# Patient Record
Sex: Female | Born: 1950 | Race: White | Hispanic: No | Smoking: Current every day smoker
Health system: Southern US, Community
[De-identification: ages and names within clinical notes are randomized; demographics above are authoritative.]

## PROBLEM LIST (undated history)

## (undated) DIAGNOSIS — K529 Noninfective gastroenteritis and colitis, unspecified: Secondary | ICD-10-CM

## (undated) DIAGNOSIS — C801 Malignant (primary) neoplasm, unspecified: Secondary | ICD-10-CM

## (undated) DIAGNOSIS — I10 Essential (primary) hypertension: Secondary | ICD-10-CM

## (undated) DIAGNOSIS — I739 Peripheral vascular disease, unspecified: Secondary | ICD-10-CM

## (undated) DIAGNOSIS — K219 Gastro-esophageal reflux disease without esophagitis: Secondary | ICD-10-CM

## (undated) DIAGNOSIS — Z720 Tobacco use: Secondary | ICD-10-CM

## (undated) DIAGNOSIS — E785 Hyperlipidemia, unspecified: Secondary | ICD-10-CM

## (undated) HISTORY — DX: Peripheral vascular disease, unspecified: I73.9

## (undated) HISTORY — DX: Gastro-esophageal reflux disease without esophagitis: K21.9

## (undated) HISTORY — DX: Tobacco use: Z72.0

## (undated) HISTORY — PX: ABDOMINAL HYSTERECTOMY: SHX81

## (undated) HISTORY — PX: CHOLECYSTECTOMY: SHX55

## (undated) HISTORY — PX: APPENDECTOMY: SHX54

## (undated) HISTORY — PX: MASTECTOMY: SHX3

## (undated) HISTORY — DX: Hyperlipidemia, unspecified: E78.5

## (undated) HISTORY — DX: Malignant (primary) neoplasm, unspecified: C80.1

## (undated) HISTORY — DX: Essential (primary) hypertension: I10

## (undated) HISTORY — PX: NOSE SURGERY: SHX723

## (undated) HISTORY — DX: Noninfective gastroenteritis and colitis, unspecified: K52.9

---

## 1999-04-25 ENCOUNTER — Other Ambulatory Visit: Admission: RE | Admit: 1999-04-25 | Discharge: 1999-04-25 | Payer: Self-pay | Admitting: Urology

## 1999-11-11 ENCOUNTER — Encounter: Admission: RE | Admit: 1999-11-11 | Discharge: 1999-11-11 | Payer: Self-pay | Admitting: Family Medicine

## 1999-11-11 ENCOUNTER — Encounter: Payer: Self-pay | Admitting: Family Medicine

## 2000-06-11 ENCOUNTER — Encounter: Admission: RE | Admit: 2000-06-11 | Discharge: 2000-06-26 | Payer: Self-pay | Admitting: Family Medicine

## 2000-10-28 ENCOUNTER — Encounter (INDEPENDENT_AMBULATORY_CARE_PROVIDER_SITE_OTHER): Payer: Self-pay | Admitting: Specialist

## 2000-10-28 ENCOUNTER — Encounter: Payer: Self-pay | Admitting: Internal Medicine

## 2000-10-28 ENCOUNTER — Other Ambulatory Visit: Admission: RE | Admit: 2000-10-28 | Discharge: 2000-10-28 | Payer: Self-pay | Admitting: Internal Medicine

## 2000-11-06 ENCOUNTER — Ambulatory Visit (HOSPITAL_COMMUNITY): Admission: RE | Admit: 2000-11-06 | Discharge: 2000-11-06 | Payer: Self-pay | Admitting: Family Medicine

## 2000-11-06 ENCOUNTER — Encounter: Payer: Self-pay | Admitting: Family Medicine

## 2000-12-14 ENCOUNTER — Encounter: Payer: Self-pay | Admitting: Internal Medicine

## 2000-12-14 ENCOUNTER — Ambulatory Visit (HOSPITAL_COMMUNITY): Admission: RE | Admit: 2000-12-14 | Discharge: 2000-12-14 | Payer: Self-pay | Admitting: Internal Medicine

## 2001-03-07 ENCOUNTER — Ambulatory Visit (HOSPITAL_COMMUNITY): Admission: RE | Admit: 2001-03-07 | Discharge: 2001-03-07 | Payer: Self-pay | Admitting: Orthopaedic Surgery

## 2001-03-07 ENCOUNTER — Encounter: Payer: Self-pay | Admitting: Orthopaedic Surgery

## 2001-12-14 ENCOUNTER — Encounter: Admission: RE | Admit: 2001-12-14 | Discharge: 2001-12-14 | Payer: Self-pay | Admitting: Family Medicine

## 2001-12-14 ENCOUNTER — Encounter: Payer: Self-pay | Admitting: Family Medicine

## 2004-06-05 ENCOUNTER — Ambulatory Visit: Payer: Self-pay | Admitting: Family Medicine

## 2004-08-05 ENCOUNTER — Ambulatory Visit: Payer: Self-pay | Admitting: Family Medicine

## 2004-08-12 ENCOUNTER — Ambulatory Visit: Payer: Self-pay | Admitting: Family Medicine

## 2004-09-09 ENCOUNTER — Ambulatory Visit: Payer: Self-pay | Admitting: Family Medicine

## 2004-09-09 ENCOUNTER — Encounter: Admission: RE | Admit: 2004-09-09 | Discharge: 2004-09-09 | Payer: Self-pay | Admitting: Family Medicine

## 2004-09-20 ENCOUNTER — Encounter: Admission: RE | Admit: 2004-09-20 | Discharge: 2004-09-20 | Payer: Self-pay | Admitting: Family Medicine

## 2004-11-22 ENCOUNTER — Ambulatory Visit: Payer: Self-pay | Admitting: Family Medicine

## 2004-11-28 ENCOUNTER — Ambulatory Visit: Payer: Self-pay | Admitting: Family Medicine

## 2004-12-02 ENCOUNTER — Ambulatory Visit: Payer: Self-pay | Admitting: Family Medicine

## 2005-11-06 ENCOUNTER — Ambulatory Visit: Payer: Self-pay | Admitting: Family Medicine

## 2005-11-13 ENCOUNTER — Ambulatory Visit: Payer: Self-pay | Admitting: Family Medicine

## 2005-11-13 ENCOUNTER — Encounter: Admission: RE | Admit: 2005-11-13 | Discharge: 2005-11-13 | Payer: Self-pay | Admitting: Family Medicine

## 2005-11-28 ENCOUNTER — Ambulatory Visit: Payer: Self-pay | Admitting: Cardiology

## 2006-02-03 ENCOUNTER — Ambulatory Visit: Payer: Self-pay | Admitting: Family Medicine

## 2006-07-07 ENCOUNTER — Ambulatory Visit: Payer: Self-pay | Admitting: Family Medicine

## 2006-10-26 ENCOUNTER — Encounter: Payer: Self-pay | Admitting: Family Medicine

## 2006-10-26 DIAGNOSIS — I10 Essential (primary) hypertension: Secondary | ICD-10-CM | POA: Insufficient documentation

## 2006-10-26 DIAGNOSIS — F17200 Nicotine dependence, unspecified, uncomplicated: Secondary | ICD-10-CM

## 2006-10-26 DIAGNOSIS — E785 Hyperlipidemia, unspecified: Secondary | ICD-10-CM | POA: Insufficient documentation

## 2006-10-26 DIAGNOSIS — I739 Peripheral vascular disease, unspecified: Secondary | ICD-10-CM

## 2006-11-12 DIAGNOSIS — Z853 Personal history of malignant neoplasm of breast: Secondary | ICD-10-CM

## 2007-01-25 ENCOUNTER — Ambulatory Visit: Payer: Self-pay | Admitting: Family Medicine

## 2007-01-25 DIAGNOSIS — L0291 Cutaneous abscess, unspecified: Secondary | ICD-10-CM

## 2007-01-25 DIAGNOSIS — L0390 Cellulitis, unspecified: Secondary | ICD-10-CM

## 2007-02-03 ENCOUNTER — Encounter: Payer: Self-pay | Admitting: Family Medicine

## 2007-07-22 ENCOUNTER — Encounter: Payer: Self-pay | Admitting: Family Medicine

## 2007-07-23 ENCOUNTER — Ambulatory Visit: Payer: Self-pay | Admitting: Family Medicine

## 2007-07-28 DIAGNOSIS — L820 Inflamed seborrheic keratosis: Secondary | ICD-10-CM | POA: Insufficient documentation

## 2007-12-28 ENCOUNTER — Ambulatory Visit: Payer: Self-pay | Admitting: Family Medicine

## 2007-12-28 LAB — CONVERTED CEMR LAB
ALT: 15 units/L (ref 0–35)
Albumin: 3.7 g/dL (ref 3.5–5.2)
Bilirubin, Direct: 0.1 mg/dL (ref 0.0–0.3)
CO2: 31 meq/L (ref 19–32)
Calcium: 9.2 mg/dL (ref 8.4–10.5)
Eosinophils Absolute: 0.4 10*3/uL (ref 0.0–0.7)
Eosinophils Relative: 4.5 % (ref 0.0–5.0)
GFR calc Af Amer: 95 mL/min
GFR calc non Af Amer: 79 mL/min
HDL: 34.9 mg/dL — ABNORMAL LOW (ref >39.0)
Lymphocytes Relative: 33.9 % (ref 12.0–46.0)
Monocytes Relative: 5 % (ref 3.0–12.0)
Neutrophils Relative %: 56 % (ref 43.0–77.0)
Nitrite: NEGATIVE
RBC: 4.52 M/uL (ref 3.87–5.11)
RDW: 12.8 % (ref 11.5–14.6)
TSH: 0.45 microintl units/mL (ref 0.35–5.50)
Total Bilirubin: 0.6 mg/dL (ref 0.3–1.2)
Total Protein: 6.8 g/dL (ref 6.0–8.3)
Urobilinogen, UA: 0.2
WBC Urine, dipstick: NEGATIVE
WBC: 8.9 10*3/uL (ref 4.5–10.5)
pH: 8.5

## 2008-01-04 ENCOUNTER — Ambulatory Visit: Payer: Self-pay | Admitting: Family Medicine

## 2008-05-22 DIAGNOSIS — J069 Acute upper respiratory infection, unspecified: Secondary | ICD-10-CM | POA: Insufficient documentation

## 2008-05-24 ENCOUNTER — Ambulatory Visit: Payer: Self-pay | Admitting: Family Medicine

## 2008-06-22 ENCOUNTER — Telehealth: Payer: Self-pay | Admitting: Family Medicine

## 2008-06-29 ENCOUNTER — Ambulatory Visit: Payer: Self-pay | Admitting: Family Medicine

## 2008-06-29 DIAGNOSIS — K219 Gastro-esophageal reflux disease without esophagitis: Secondary | ICD-10-CM

## 2008-08-21 ENCOUNTER — Telehealth: Payer: Self-pay | Admitting: Family Medicine

## 2008-09-01 ENCOUNTER — Ambulatory Visit: Payer: Self-pay | Admitting: Internal Medicine

## 2008-09-01 DIAGNOSIS — H6520 Chronic serous otitis media, unspecified ear: Secondary | ICD-10-CM | POA: Insufficient documentation

## 2009-04-23 ENCOUNTER — Ambulatory Visit: Payer: Self-pay | Admitting: Family Medicine

## 2009-04-26 ENCOUNTER — Ambulatory Visit: Payer: Self-pay | Admitting: Family Medicine

## 2009-04-26 DIAGNOSIS — J45909 Unspecified asthma, uncomplicated: Secondary | ICD-10-CM | POA: Insufficient documentation

## 2009-04-30 ENCOUNTER — Ambulatory Visit: Payer: Self-pay | Admitting: Family Medicine

## 2009-06-26 ENCOUNTER — Ambulatory Visit: Payer: Self-pay | Admitting: Family Medicine

## 2009-10-17 ENCOUNTER — Ambulatory Visit: Payer: Self-pay | Admitting: Family Medicine

## 2009-10-17 DIAGNOSIS — S6390XA Sprain of unspecified part of unspecified wrist and hand, initial encounter: Secondary | ICD-10-CM | POA: Insufficient documentation

## 2009-10-18 ENCOUNTER — Ambulatory Visit: Payer: Self-pay | Admitting: Family Medicine

## 2010-02-12 ENCOUNTER — Telehealth: Payer: Self-pay | Admitting: Family Medicine

## 2010-02-28 ENCOUNTER — Ambulatory Visit: Payer: Self-pay | Admitting: Family Medicine

## 2010-02-28 DIAGNOSIS — J029 Acute pharyngitis, unspecified: Secondary | ICD-10-CM

## 2010-02-28 LAB — CONVERTED CEMR LAB
Albumin: 4 g/dL (ref 3.5–5.2)
Basophils Absolute: 0 10*3/uL (ref 0.0–0.1)
Bilirubin, Direct: 0.1 mg/dL (ref 0.0–0.3)
Chloride: 99 meq/L (ref 96–112)
Creatinine, Ser: 0.7 mg/dL (ref 0.4–1.2)
Eosinophils Absolute: 0.4 10*3/uL (ref 0.0–0.7)
Eosinophils Relative: 3.5 % (ref 0.0–5.0)
Glucose, Bld: 85 mg/dL (ref 70–99)
Lymphs Abs: 2.7 10*3/uL (ref 0.7–4.0)
Monocytes Absolute: 0.5 10*3/uL (ref 0.1–1.0)
Nitrite: NEGATIVE
Platelets: 279 10*3/uL (ref 150.0–400.0)
Rapid Strep: NEGATIVE
Sodium: 144 meq/L (ref 135–145)
Total Bilirubin: 0.6 mg/dL (ref 0.3–1.2)
Triglycerides: 170 mg/dL — ABNORMAL HIGH (ref 0.0–149.0)
WBC: 9.9 10*3/uL (ref 4.5–10.5)
pH: 8.5

## 2010-03-11 ENCOUNTER — Ambulatory Visit: Payer: Self-pay | Admitting: Family Medicine

## 2010-04-30 ENCOUNTER — Encounter: Payer: Self-pay | Admitting: Family Medicine

## 2010-07-25 NOTE — Progress Notes (Signed)
Summary: refills  Phone Note Call from Patient Message from:  Patient on February 13, 2010 8:11 AM  Caller: Patient Summary of Call: patient is calling because she would like a refill of her medication.  However the patient has not had a physical or labs since 09.  Please call the patient and have her schedule.  [phone] Initial call taken by: Kern Reap CMA Duncan Dull),  February 12, 2010 2:55 PM  Follow-up for Phone Call        Called pts cell # and left msg for her to c/b so appt can be scheduled.... Also called home # and left msg with pts husband req pt to c/b.  Follow-up by: Debbra Riding,  February 12, 2010 3:03 PM  Additional Follow-up for Phone Call Additional follow up Details #1::        Called pt this morning and spoke with her about scheduling a cpx / labwork with Dr Tawanna Cooler.... Pt scheduled appt for labwork on 9/8 @ 9:45am...Marland KitchenMarland KitchenCPX w/ Dr Tawanna Cooler on 9/19 @ 2pm.... Pt adv she is almost out of meds and req that Rx for enough of med till her appt date be sent into [facility] [address].Marland KitchenMarland KitchenMarland KitchenRecent refill request sent?  Additional Follow-up by: Debbra Riding,  February 13, 2010 8:11 AM    Prescriptions: ZOCOR 40 MG  TABS (SIMVASTATIN) 1 tab @ bedtime  #30 x 1   Entered by:   Kern Reap CMA (AAMA)   Authorized by:   Roderick Pee MD   Signed by:   Kern Reap CMA (AAMA) on 02/13/2010   Method used:   [facility]        [facility]  [address] [id number]       [address]       [place], [place]  [address]       Ph: [phone] or [phone]       Fax: [phone]   RxID:   [id number] PRILOSEC 20 MG  CPDR (OMEPRAZOLE) one daily  #30 x 1   Entered by:   Kern Reap CMA (AAMA)   Authorized by:   Roderick Pee MD   Signed by:   Kern Reap CMA (AAMA) on 02/13/2010   Method used:   [facility]        [facility]  [address] [id number]       [address]       [place], [place]  [address]       Ph: [phone] or  [phone]       Fax: [phone]   RxID:   [id number] TENORETIC 50 50-25 MG  TABS (ATENOLOL-CHLORTHALIDONE) one by mouth daily  #30 x 1   Entered by:   Kern Reap CMA (AAMA)   Authorized by:   Roderick Pee MD   Signed by:   Kern Reap CMA (AAMA) on 02/13/2010   Method used:   [facility]        [facility]  [address] [id number]       [address]       [place], [place]  [address]       Ph: [phone] or [phone]       Fax: [phone]   RxID:   [id number]   Appended Document: refills and called in to pharmacy

## 2010-07-25 NOTE — Letter (Signed)
Summary: Referral - not able to see patient  [facility]  [address] [place], [place] [address]   Phone: [phone]  Fax: [phone]    April 30, 2010   Tinnie Gens A. Tawanna Cooler, M.D. [address] [place], [place] [address]   Re:   Natalie Benson DOB:  1950/10/12 MRN:   [id number]    Dear Dr. Tawanna Cooler:  Thank you for your kind referral of the above patient.  We have attempted to schedule the recommended procedure Screening Colonoscopy but have not been able to schedule because:  ___ The patient was not available by phone and/or has not returned our calls.   X  The patient declined to schedule the procedure at this time.  We appreciate the referral and hope that we will have the opportunity to treat this patient in the future.    Sincerely,    [facility] [phone]

## 2010-07-25 NOTE — Assessment & Plan Note (Signed)
Summary: ? BRONCHIAL ISSUES//CCM   Vital Signs:  Patient profile:   60 year old female Height:      66 inches Weight:      166.50 pounds BMI:     26.97 O2 Sat:      96 % on Room air Temp:     97.8 degrees F oral Pulse rate:   67 / minute BP sitting:   134 / 78  (left arm)  Vitals Entered By: Lucious Groves (June 26, 2009 12:07 PM)  O2 Flow:  Room air CC: Pt c/o cold and bronchial issues with occ SOB x 2 weeks. Cough is mucous producing and per pt it is white to yellow in color. Pt is still smoking, but has cut down.f/kb   CC:  Pt c/o cold and bronchial issues with occ SOB x 2 weeks. Cough is mucous producing and per pt it is white to yellow in color. Pt is still smoking and but has cut down.f/kb.  History of Present Illness: care of is a 60 year old, married female, smoker, who comes in with a 10-day history of head congestion, cough.  She has no fever, chills, earache, sore throat, or sputum production.  She continues to smoke.  In November.  She had a flare of her COPD which required prednisone.  She declines a formal smoking cessation program.  However, she is agreeable to trying the nicotine patches  Current Medications (verified): 1)  Adult Aspirin Low Strength 81 Mg  Chew (Aspirin) .... One By Mouth Daily 2)  Tenoretic 50 50-25 Mg  Tabs (Atenolol-Chlorthalidone) .... One By Mouth Daily 3)  Prilosec 20 Mg  Cpdr (Omeprazole) .... One Daily 4)  Zocor 40 Mg  Tabs (Simvastatin) .Marland Kitchen.. 1 Tab @ Bedtime 5)  Hydromet 5-1.5 Mg/20ml Syrp (Hydrocodone-Homatropine) .Marland Kitchen.. 1 or 2 Tsps Three Times A Day As Needed 6)  Hydromet 5-1.5 Mg/67ml Syrp (Hydrocodone-Homatropine) .Marland Kitchen.. 1 or 2 Tsps At Bedtime As Needed 7)  Primatene Asthma 12.5-200 Mg Tabs (Ephedrine-Guaifenesin) .Marland Kitchen.. 1 By Two Times A Day or Tid  Allergies (verified): No Known Drug Allergies  Past History:  Past medical, surgical, family and social histories (including risk factors) reviewed for relevance to current acute and chronic  problems.  Past Medical History: Reviewed history from 07/26/2008 and no changes required. Hyperlipidemia Hypertension Breast cancer, hx of tobacco abuse cholecystectomy appendectomy vaginal hysterectomy peripheral vascular disease.  Right carotid bruit GERD Collangenous colitis  Past Surgical History: Reviewed history from 07/26/2008 and no changes required. Cholecystectomy 1970's Hysterectomy-vag dub Mastectomy-bil. Appendectomy  Family History: Reviewed history from 01/04/2008 and no changes required. father died at 18 of congestive heart failure mother died at 79 GI cancer  3 brothers, one has COPD, one has hypertension4 sisters, one has had breast cancer twice noted to have bed rest, cancer.  Once  Social History: Reviewed history from 01/04/2008 and no changes required. Occupation: Married Current Smoker Alcohol use-no Drug use-no Regular exercise-yes  Review of Systems      See HPI  Physical Exam  General:  Well-developed,well-nourished,in no acute distress; alert,appropriate and cooperative throughout examination Head:  Normocephalic and atraumatic without obvious abnormalities. No apparent alopecia or balding. Eyes:  No corneal or conjunctival inflammation noted. EOMI. Perrla. Funduscopic exam benign, without hemorrhages, exudates or papilledema. Vision grossly normal. Ears:  External ear exam shows no significant lesions or deformities.  Otoscopic examination reveals clear canals, tympanic membranes are intact bilaterally without bulging, retraction, inflammation or discharge. Hearing is grossly normal bilaterally. Nose:  External  nasal examination shows no deformity or inflammation. Nasal mucosa are pink and moist without lesions or exudates. Mouth:  Oral mucosa and oropharynx without lesions or exudates.  Teeth in good repair. Neck:  No deformities, masses, or tenderness noted. Chest Wall:  No deformities, masses, or tenderness noted. 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 Uad  Orders: Tobacco use cessation intermediate 3-10 minutes (19[address])  Problem # 2:  VIRAL URI (ICD-465.9) Assessment: Deteriorated  Her updated medication list for this problem includes:    Adult Aspirin Low Strength 81 Mg Chew (Aspirin) ..... One by mouth daily    Hydromet 5-1.5 Mg/25ml Syrp (Hydrocodone-homatropine) .Marland Kitchen... 1 or 2 tsps three times a day as needed    Hydromet 5-1.5 Mg/5ml Syrp (Hydrocodone-homatropine) .Marland Kitchen... 1 or 2 tsps at bedtime as needed    Primatene Asthma 12.5-200 Mg Tabs (Ephedrine-guaifenesin) .Marland Kitchen... 1 by two times a day or tid  Complete Medication List: 1)  Adult Aspirin Low Strength 81 Mg Chew (Aspirin) .... One by mouth daily 2)  Tenoretic 50 50-25 Mg Tabs (Atenolol-chlorthalidone) .... One by mouth daily 3)  Prilosec 20 Mg Cpdr (Omeprazole) .... One daily 4)  Zocor 40 Mg Tabs (Simvastatin) .Marland Kitchen.. 1 tab @ bedtime 5)  Hydromet 5-1.5 Mg/58ml Syrp (Hydrocodone-homatropine) .Marland Kitchen.. 1 or 2 tsps three times a day as needed 6)  Hydromet 5-1.5 Mg/66ml Syrp (Hydrocodone-homatropine) .Marland Kitchen.. 1 or 2 tsps at bedtime as needed 7)  Primatene Asthma 12.5-200 Mg Tabs (Ephedrine-guaifenesin) .Marland Kitchen.. 1 by two times a day or tid  Patient Instructions: 1)  drink 30 ounces of water daily. 2)  u  may take one or 2 teaspoons of Hydromet at bedtime as needed for nighttime cough. 3)  Stop smoking completely and begin the nicotine patches.  21 mg daily x 3 weeks, 14 mg daily, x 3 weeks, 7 mg daily, x 3 weeks. 4)  Prednisone 20 mg one daily, x 3 days a half a tablet x 3 days, then half a tablet Monday, Wednesday, Friday, for a two week taper Prescriptions: HYDROMET 5-1.5 MG/5ML SYRP  (HYDROCODONE-HOMATROPINE) 1 or 2 tsps three times a day as needed  #8oz x 1   Entered and Authorized by:   Roderick Pee MD   Signed by:   Roderick Pee MD on 06/26/2009   Method used:   Print then Give to Patient   RxID:   [id number]

## 2010-07-25 NOTE — Assessment & Plan Note (Signed)
Summary: CPX // RS   Vital Signs:  Patient profile:   60 year old female Height:      64.75 inches Weight:      147 pounds BMI:     24.74 Temp:     98.3 degrees F oral BP sitting:   120 / 78  (left arm) Cuff size:   regular  Vitals Entered By: Kern Reap CMA Duncan Dull) (March 11, 2010 1:46 PM) CC: cpx Is Patient Diabetic? No Pain Assessment Patient in pain? no        CC:  cpx.  History of Present Illness: Natalie Benson is a 60 year old, .......Marland Kitchen recently separated....... female smoker, who lives with her son, now comes in for general physical examination  She has underlying hypertension, for which he takes Tenoretic 13 -- 25 daily.  BP 120/78.  She takes Zocor 40 mg nightly for hyperlipidemia.  Lipids are well no side effects from medication.  She takes Prilosec 20 mg OTC for reflux asymptomatic on medication.  She also takes a baby aspirin.  She gets routine eye care.  Dental care is not due me at the [facility], monthly.  She's had bilateral mastectomies, and because of this no longer gets mammography.  Would recommend colonoscopy in the past, but she's never gone.  Tetanus 2007 refuses a flu shot.  Also, she would not like to discuss smoking cessation.  We talked about this in the past, and she is declined.  She does have a left carotid bruit  Preventive Screening-Counseling & Management  Alcohol-Tobacco     Smoking Cessation Counseling: yes  Allergies (verified): No Known Drug Allergies  Past History:  Past medical, surgical, family and social histories (including risk factors) reviewed, and no changes noted (except as noted below).  Past Medical History: Reviewed history from 07/26/2008 and no changes required. Hyperlipidemia Hypertension Breast cancer, hx of tobacco abuse cholecystectomy appendectomy vaginal hysterectomy peripheral vascular disease.  Right carotid bruit GERD Collangenous colitis  Past Surgical History: Reviewed history from 07/26/2008 and no  changes required. Cholecystectomy 1970's Hysterectomy-vag dub Mastectomy-bil. Appendectomy  Family History: Reviewed history from 01/04/2008 and no changes required. father died at 73 of congestive heart failure mother died at 60 GI cancer  3 brothers, one has COPD, one has hypertension4 sisters, one has had breast cancer twice noted to have bed rest, cancer.  Once  Social History: Reviewed history from 01/04/2008 and no changes required. Occupation: Married Current Smoker Alcohol use-no Drug use-no Regular exercise-yes  Review of Systems      See HPI  Physical Exam  General:  Well-developed,well-nourished,in no acute distress; alert,appropriate and cooperative throughout examination Head:  Normocephalic and atraumatic without obvious abnormalities. No apparent alopecia or balding. Eyes:  No corneal or conjunctival inflammation noted. EOMI. Perrla. Funduscopic exam benign, without hemorrhages, exudates or papilledema. Vision grossly normal. Ears:  External ear exam shows no significant lesions or deformities.  Otoscopic examination reveals clear canals, tympanic membranes are intact bilaterally without bulging, retraction, inflammation or discharge. Hearing is grossly normal bilaterally. Nose:  External nasal examination shows no deformity or inflammation. Nasal mucosa are pink and moist without lesions or exudates. Mouth:  Oral mucosa and oropharynx without lesions or exudates.  Teeth in good repair. Neck:  No deformities, masses, or tenderness noted. Chest Wall:  No deformities, masses, or tenderness noted. Breasts:  both breasts have been surgically removed in 1994/95......... the scars are well-healed.  No adenopathy in the axillary area Lungs:  Normal respiratory effort, chest expands symmetrically. Lungs are  clear to auscultation, no crackles or wheezes. Heart:  Normal rate and regular rhythm. S1 and S2 normal without gallop, murmur, click, rub or other extra  sounds. Abdomen:  Bowel sounds positive,abdomen soft and non-tender without masses, organomegaly or hernias noted. Rectal:  No external abnormalities noted. Normal sphincter tone. No rectal masses or tenderness. Genitalia:  Pelvic Exam:        External: normal female genitalia without lesions or masses        Vagina: normal without lesions or masses        Cervix: normal without lesions or masses        Adnexa: normal bimanual exam without masses or fullness        Uterus: normal by palpation        Pap smear: not performed Msk:  No deformity or scoliosis noted of thoracic or lumbar spine.   Pulses:  R and L carotid,radial,femoral,dorsalis pedis and posterior tibial pulses are full and equal bilaterally Extremities:  No clubbing, cyanosis, edema, or deformity noted with normal full range of motion of all joints.   Neurologic:  No cranial nerve deficits noted. Station and gait are normal. Plantar reflexes are down-going bilaterally. DTRs are symmetrical throughout. Sensory, motor and coordinative functions appear intact. Skin:  Intact without suspicious lesions or rashes Cervical Nodes:  No lymphadenopathy noted Axillary Nodes:  No palpable lymphadenopathy Inguinal Nodes:  No significant adenopathy Psych:  Cognition and judgment appear intact. Alert and cooperative with normal attention span and concentration. No apparent delusions, illusions, hallucinations   Impression & Recommendations:  Problem # 1:  GERD (ICD-530.81) Assessment Improved  Her updated medication list for this problem includes:    Prilosec 20 Mg Cpdr (Omeprazole) ..... One daily  Orders: Prescription Created Electronically [phone])  Problem # 2:  BREAST CANCER, HX OF (ICD-V10.3) Assessment: Unchanged  Orders: Prescription Created Electronically [phone])  Problem # 3:  TOBACCO ABUSE (ICD-305.1) Assessment: Unchanged  Orders: Prescription Created Electronically [phone]) Tobacco use cessation intermediate 3-10  minutes (56433)  Problem # 4:  HYPERTENSION (ICD-401.9) Assessment: Improved  Her updated medication list for this problem includes:    Tenoretic 50 50-25 Mg Tabs (Atenolol-chlorthalidone) ..... One by mouth daily    Tenormin 50 Mg Tabs (Atenolol) .Marland Kitchen... Take 1 tablet by mouth every morning  Orders: Prescription Created Electronically [phone]) EKG w/ Interpretation (93000)  Problem # 5:  HYPERLIPIDEMIA (ICD-272.4) Assessment: Improved  Her updated medication list for this problem includes:    Zocor 40 Mg Tabs (Simvastatin) .Marland Kitchen... 1 tab @ bedtime  Orders: Prescription Created Electronically [phone]) EKG w/ Interpretation (93000)  Problem # 6:  PVD (ICD-443.9) Assessment: Unchanged  Orders: Prescription Created Electronically [phone])  Complete Medication List: 1)  Adult Aspirin Low Strength 81 Mg Chew (Aspirin) .... One by mouth daily 2)  Tenoretic 50 50-25 Mg Tabs (Atenolol-chlorthalidone) .... One by mouth daily 3)  Prilosec 20 Mg Cpdr (Omeprazole) .... One daily 4)  Zocor 40 Mg Tabs (Simvastatin) .Marland Kitchen.. 1 tab @ bedtime 5)  Hydromet 5-1.5 Mg/58ml Syrp (Hydrocodone-homatropine) .... 1/2 at bedtime as needed 6)  Tenormin 50 Mg Tabs (Atenolol) .... Take 1 tablet by mouth every morning  Other Orders: Gastroenterology Referral (GI) T-2 View CXR (71020TC)  Patient Instructions: 1)  Please schedule a follow-up appointment in 1 year. 2)  Tobacco is very bad for your health and your loved ones! You Should stop smoking!. 3)  Stop Smoking Tips: Choose a Quit date. Cut down before the Quit date. decide what you will  do as a substitute when you feel the urge to smoke(gum,toothpick,exercise). 4)  Schedule a colonoscopy/sigmoidoscopy to help detect colon cancer. 5)  Take an Aspirin every day. Prescriptions: TENORMIN 50 MG TABS (ATENOLOL) Take 1 tablet by mouth every morning  #100 x 3   Entered and Authorized by:   Roderick Pee MD   Signed by:   Roderick Pee MD on 03/11/2010   Method  used:   [facility]  [address] [id number]       [address]       [place], [place]  [address]       Ph: [phone] or [phone]       Fax: [phone]   RxID:   [id number]

## 2010-07-25 NOTE — Assessment & Plan Note (Signed)
Summary: ST/RCD   Vital Signs:  Patient profile:   60 year old female Weight:      146 pounds Temp:     98.1 degrees F oral BP sitting:   120 / 80  (left arm) Cuff size:   regular  Vitals Entered By: Kathrynn Speed CMA (February 28, 2010 10:06 AM) CC: strep throat, src Is Patient Diabetic? No   CC:  strep throat and src.  History of Present Illness: Natalie Benson is a 60 year old female, smoker.........Marland Kitchen recently separated now living with her son and his two children, 4, and 8........ who comes in today with a one-day history of a sore throat.  Review of systems negative.  She is also here for annual physical labs  Preventive Screening-Counseling & Management  Alcohol-Tobacco     Smoking Status: current     Smoking Cessation Counseling: yes     Packs/Day: 1.0  Current Medications (verified): 1)  Adult Aspirin Low Strength 81 Mg  Chew (Aspirin) .... One By Mouth Daily 2)  Tenoretic 50 50-25 Mg  Tabs (Atenolol-Chlorthalidone) .... One By Mouth Daily 3)  Prilosec 20 Mg  Cpdr (Omeprazole) .... One Daily 4)  Zocor 40 Mg  Tabs (Simvastatin) .Marland Kitchen.. 1 Tab @ Bedtime 5)  Primatene Asthma 12.5-200 Mg Tabs (Ephedrine-Guaifenesin) .Marland Kitchen.. 1 By Two Times A Day or Tid  Allergies (verified): No Known Drug Allergies  Social History: Packs/Day:  1.0  Review of Systems      See HPI  Physical Exam  General:  Well-developed,well-nourished,in no acute distress; alert,appropriate and cooperative throughout examination Mouth:  Oral mucosa and oropharynx without lesions or exudates.  Teeth in good repair. Cervical Nodes:  No lymphadenopathy noted   Problems:  Medical Problems Added: 1)  Dx of Viral Infection-unspec  (ICD-079.99) 2)  Dx of Sore Throat  (ICD-462)  Impression & Recommendations:  Problem # 1:  VIRAL INFECTION-UNSPEC (ICD-079.99) Assessment New  The following medications were removed from the medication list:    Hydromet 5-1.5 Mg/67ml Syrp (Hydrocodone-homatropine) .Marland Kitchen... 1 or 2  tsps three times a day as needed    Hydromet 5-1.5 Mg/28ml Syrp (Hydrocodone-homatropine) .Marland Kitchen... 1 or 2 tsps at bedtime as needed Her updated medication list for this problem includes:    Adult Aspirin Low Strength 81 Mg Chew (Aspirin) ..... One by mouth daily    Primatene Asthma 12.5-200 Mg Tabs (Ephedrine-guaifenesin) .Marland Kitchen... 1 by two times a day or tid    Hydromet 5-1.5 Mg/67ml Syrp (Hydrocodone-homatropine) .Marland Kitchen... 1/2 at bedtime as needed  Complete Medication List: 1)  Adult Aspirin Low Strength 81 Mg Chew (Aspirin) .... One by mouth daily 2)  Tenoretic 50 50-25 Mg Tabs (Atenolol-chlorthalidone) .... One by mouth daily 3)  Prilosec 20 Mg Cpdr (Omeprazole) .... One daily 4)  Zocor 40 Mg Tabs (Simvastatin) .Marland Kitchen.. 1 tab @ bedtime 5)  Primatene Asthma 12.5-200 Mg Tabs (Ephedrine-guaifenesin) .Marland Kitchen.. 1 by two times a day or tid 6)  Hydromet 5-1.5 Mg/58ml Syrp (Hydrocodone-homatropine) .... 1/2 at bedtime as needed  Other Orders: Rapid Strep (47829)  Patient Instructions: 1)  Hydromet one half to 1 teaspoon nightly p.r.n. for severe sore throat.  Tylenol, etc. return p.r.n. Prescriptions: HYDROMET 5-1.5 MG/5ML SYRP (HYDROCODONE-HOMATROPINE) 1/2 at bedtime as needed  #4oz x 1   Entered and Authorized by:   Roderick Pee MD   Signed by:   Roderick Pee MD on 02/28/2010   Method used:   Print then Give to Patient   RxID:   [id number]  Laboratory Results  Date/Time Received: February 28, 2010 10:15 AM  Date/Time Reported: February 28, 2010 10:15 AM   Other Tests  Rapid Strep: negative Comments Wynona Canes, CMA  February 28, 2010 10:15 AM

## 2010-07-25 NOTE — Assessment & Plan Note (Signed)
Summary: T PT/NEEDS LATE APPT/THINKS FX FINGER/PS   Vital Signs:  Patient profile:   60 year old female Weight:      159 pounds BMI:     25.76 Temp:     98.5 degrees F oral BP sitting:   128 / 80  (left arm) Cuff size:   regular  Vitals Entered By: Raechel Ache, RN (October 17, 2009 4:35 PM) CC: C/o red, swollen, painful L ring finger- jammed it yesterday.   History of Present Illness: Here for an injury to her left 4th finger which occured yesterday while at work. She and some coworkers were bouncing a rubber ball, and the ball struck her finger end on while it was extended. Now she has pain and swelling around the distal end of the finger. Using ice and Advil.   Allergies: No Known Drug Allergies  Past History:  Past Medical History: Reviewed history from 07/26/2008 and no changes required. Hyperlipidemia Hypertension Breast cancer, hx of tobacco abuse cholecystectomy appendectomy vaginal hysterectomy peripheral vascular disease.  Right carotid bruit GERD Collangenous colitis  Past Surgical History: Reviewed history from 07/26/2008 and no changes required. Cholecystectomy 1970's Hysterectomy-vag dub Mastectomy-bil. Appendectomy  Review of Systems  The patient denies anorexia, fever, weight loss, weight gain, vision loss, decreased hearing, hoarseness, chest pain, syncope, dyspnea on exertion, peripheral edema, prolonged cough, headaches, hemoptysis, abdominal pain, melena, hematochezia, severe indigestion/heartburn, hematuria, incontinence, genital sores, muscle weakness, suspicious skin lesions, transient blindness, difficulty walking, depression, unusual weight change, abnormal bleeding, enlarged lymph nodes, angioedema, breast masses, and testicular masses.    Physical Exam  General:  Well-developed,well-nourished,in no acute distress; alert,appropriate and cooperative throughout examination Extremities:  the left 4th finger is swollen and tender around the DIP  joint. ROM is reduced. No signs of ligamentous injury.    Impression & Recommendations:  Problem # 1:  FINGER SPRAIN (ICD-842.10)  Orders: T-Finger(s) (73140TC)  Complete Medication List: 1)  Adult Aspirin Low Strength 81 Mg Chew (Aspirin) .... One by mouth daily 2)  Tenoretic 50 50-25 Mg Tabs (Atenolol-chlorthalidone) .... One by mouth daily 3)  Prilosec 20 Mg Cpdr (Omeprazole) .... One daily 4)  Zocor 40 Mg Tabs (Simvastatin) .Marland Kitchen.. 1 tab @ bedtime 5)  Hydromet 5-1.5 Mg/81ml Syrp (Hydrocodone-homatropine) .Marland Kitchen.. 1 or 2 tsps three times a day as needed 6)  Hydromet 5-1.5 Mg/72ml Syrp (Hydrocodone-homatropine) .Marland Kitchen.. 1 or 2 tsps at bedtime as needed 7)  Primatene Asthma 12.5-200 Mg Tabs (Ephedrine-guaifenesin) .Marland Kitchen.. 1 by two times a day or tid  Patient Instructions: 1)  there is likely a small fracture here, so we will send her for some Xrays tomorrow. In the meantime continue ice and Advil. The finger was buddy taped to the 3rd finger for support.

## 2010-09-17 ENCOUNTER — Telehealth: Payer: Self-pay | Admitting: Family Medicine

## 2010-09-17 MED ORDER — ATENOLOL 50 MG PO TABS: 50.0000 mg | ORAL_TABLET | Freq: Every day | ORAL | Status: DC

## 2010-09-17 NOTE — Telephone Encounter (Signed)
Needs new rx for Atenolol to [facility] on [address].

## 2011-02-10 ENCOUNTER — Telehealth: Payer: Self-pay | Admitting: Family Medicine

## 2011-02-10 NOTE — Telephone Encounter (Signed)
Spoke with patient.

## 2011-02-10 NOTE — Telephone Encounter (Signed)
We will need to see her friends isolation.  I can't call in medication without an evaluation if she is unable to do that and I would recommend [facility] mental health

## 2011-02-10 NOTE — Telephone Encounter (Signed)
Pt is req a med to help calm nerves. Pls call in to [facility] on [address]. Pt does not have health insurance and can not afford ov.

## 2011-11-03 ENCOUNTER — Ambulatory Visit (INDEPENDENT_AMBULATORY_CARE_PROVIDER_SITE_OTHER): Payer: Self-pay | Admitting: Family

## 2011-11-03 ENCOUNTER — Encounter: Payer: Self-pay | Admitting: Family

## 2011-11-03 VITALS — BP 110/80 | Temp 98.4°F | Wt 153.0 lb

## 2011-11-03 DIAGNOSIS — N390 Urinary tract infection, site not specified: Secondary | ICD-10-CM

## 2011-11-03 DIAGNOSIS — N200 Calculus of kidney: Secondary | ICD-10-CM

## 2011-11-03 LAB — POCT URINALYSIS DIPSTICK
Bilirubin, UA: NEGATIVE
Glucose, UA: NEGATIVE
Nitrite, UA: NEGATIVE
pH, UA: 7

## 2011-11-03 MED ORDER — SULFAMETHOXAZOLE-TRIMETHOPRIM 800-160 MG PO TABS: 1.0000 | ORAL_TABLET | Freq: Two times a day (BID) | ORAL | Status: AC

## 2011-11-03 MED ORDER — PHENAZOPYRIDINE HCL 200 MG PO TABS: 200.0000 mg | ORAL_TABLET | Freq: Three times a day (TID) | ORAL | Status: AC | PRN

## 2011-11-03 NOTE — Patient Instructions (Signed)

## 2011-11-03 NOTE — Progress Notes (Signed)
  Subjective:    Patient ID: Natalie Benson, female    DOB: 05/29/1951, 61 y.o.   MRN: [id number]  HPI 61 year old white female,nonsmoker, patient of Dr. Tawanna Cooler today with complaints of urinary frequency, urgency, blood in her urine, abdominal pain times one day. She has not taken any medication for her symptoms. Denies any history of urinary tract infections or kidney stones. Patient denies any lightheadedness, dizziness, nausea, vomiting, fever or chills.   Review of Systems  Constitutional: Negative.   Respiratory: Negative.   Cardiovascular: Negative.   Gastrointestinal: Negative.   Genitourinary: Positive for dysuria, urgency, frequency, hematuria and pelvic pain. Negative for vaginal bleeding and vaginal discharge.  Musculoskeletal: Negative.   Hematological: Negative.   Psychiatric/Behavioral: Negative.    No past medical history on file.  History   Social History  . Marital Status: Married    Spouse Name: N/A    Number of Children: N/A  . Years of Education: N/A   Occupational History  . Not on file.   Social History Main Topics  . Smoking status: Not on file  . Smokeless tobacco: Not on file  . Alcohol Use: Not on file  . Drug Use: Not on file  . Sexually Active: Not on file   Other Topics Concern  . Not on file   Social History Narrative  . No narrative on file    No past surgical history on file.  No family history on file.  Not on File  Current Outpatient Prescriptions on File Prior to Visit  Medication Sig Dispense Refill  . atenolol (TENORMIN) 50 MG tablet Take 1 tablet (50 mg total) by mouth daily.  100 tablet  2    BP 110/80  Temp(Src) 98.4 F (36.9 C) (Oral)  Wt 153 lb (69.4 kg)chart     Objective:   Physical Exam  Constitutional: She is oriented to person, place, and time. She appears well-developed and well-nourished.  Cardiovascular: Normal rate, regular rhythm and normal heart sounds.   Pulmonary/Chest: Effort normal and breath sounds  normal.  Abdominal: Soft. Bowel sounds are normal. There is tenderness. There is no rebound.       Tenderness elicited to palpation over the bladder.  Neurological: She is alert and oriented to person, place, and time.  Skin: Skin is warm and dry.  Psychiatric: She has a normal mood and affect.          Assessment & Plan:  Assessment: Acute cystitis, dysuria  Plan: Per radium 200 mg twice a day as needed when necessary pain. Bactrim DS one tablet twice a day x7 days. Rest. Drink plenty of fluids. Patient call the office if symptoms worsen or persist. Recheck her schedule, and when necessary.

## 2011-12-29 ENCOUNTER — Other Ambulatory Visit: Payer: Self-pay | Admitting: *Deleted

## 2011-12-29 MED ORDER — ATENOLOL 50 MG PO TABS: 50.0000 mg | ORAL_TABLET | Freq: Every day | ORAL | Status: DC

## 2012-04-12 ENCOUNTER — Other Ambulatory Visit: Payer: Self-pay | Admitting: Family Medicine

## 2012-04-13 ENCOUNTER — Telehealth: Payer: Self-pay | Admitting: Family Medicine

## 2012-04-13 ENCOUNTER — Other Ambulatory Visit: Payer: Self-pay | Admitting: Family Medicine

## 2012-04-13 NOTE — Telephone Encounter (Signed)
[facility] Triage Call Report Triage Record Num: [id number] Operator: Albertine Grates Patient Name: Natalie Benson Call Date & Time: 04/12/2012 5:53:08PM Patient Phone: [phone] PCP: Eugenio Hoes. Todd Patient Gender: Female PCP Fax : [phone] Patient DOB: 06-03-1951 [facility] Name: [facility] Reason for Call: Caller: [name]/Patient; PCP: Kelle Darting [facility]); CB#: [phone]; States is needing refill fro Atenolol. Has 3 tabs remaining. MD advised when filled last month that would not refill any more without office visit. States has no insurance and cannot afford. States hopes MD will make exception and fill medicine. Advised per EPIC, Atenolol 50mg  qd #100 was sent to [facility] 10-21. Protocol(s) Used: Office Note Recommended Outcome per Protocol: Information Noted and Sent to Office Reason for Outcome: Caller information to office Care Advice: ~ 10/21/

## 2012-04-13 NOTE — Telephone Encounter (Signed)
Caller: Natalie Benson; Patient Name: Natalie Benson; PCP: Kelle Darting [facility]); Best Callback Phone Number: [phone] Patient states script has not been received by pharmacy. Per EPIC, Atenolol 50mg  po every day #30 called to [facility]/[address]/[phone]. Advised needs office visit.

## 2012-08-22 ENCOUNTER — Other Ambulatory Visit: Payer: Self-pay | Admitting: Family Medicine

## 2012-09-21 ENCOUNTER — Other Ambulatory Visit: Payer: Self-pay | Admitting: Family Medicine

## 2012-11-08 ENCOUNTER — Encounter: Payer: Self-pay | Admitting: Family Medicine

## 2012-11-08 ENCOUNTER — Ambulatory Visit (INDEPENDENT_AMBULATORY_CARE_PROVIDER_SITE_OTHER): Payer: Self-pay | Admitting: Family Medicine

## 2012-11-08 VITALS — BP 124/80 | Temp 98.0°F | Ht 66.0 in | Wt 163.0 lb

## 2012-11-08 DIAGNOSIS — I739 Peripheral vascular disease, unspecified: Secondary | ICD-10-CM

## 2012-11-08 DIAGNOSIS — F17200 Nicotine dependence, unspecified, uncomplicated: Secondary | ICD-10-CM

## 2012-11-08 DIAGNOSIS — I10 Essential (primary) hypertension: Secondary | ICD-10-CM

## 2012-11-08 DIAGNOSIS — Z853 Personal history of malignant neoplasm of breast: Secondary | ICD-10-CM

## 2012-11-08 MED ORDER — ATENOLOL 50 MG PO TABS: ORAL_TABLET | ORAL | Status: DC

## 2012-11-08 MED ORDER — VARENICLINE TARTRATE 1 MG PO TABS: ORAL_TABLET | ORAL | Status: DC

## 2012-11-08 NOTE — Progress Notes (Signed)
  Subjective:    Patient ID: Natalie Benson, female    DOB: February 19, 1951, 62 y.o.   MRN: [id number]  HPI Natalie Benson is a 62 year old divorced female smoker one pack a day who comes in today for general physical examination because of a history of hypertension and tobacco abuse  She takes Tenormin 50 mg daily for blood pressure control BP 124/80  She continues to smoke. She now has expressed a desire to stop.  Her last eye exam by Dr. Elmer Picker was 3 years ago, she does not get regular dental care, she does not check her breasts monthly........ She had bilateral mastectomies 1994 for cancer no reconstruction therefore exam very easy,,,,,,,, she does not get mammography to she has no breast tissue. She had a chest x-ray in 2011 which showed no malignancy. She's never had a colonoscopy. Tetanus was 2007.  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 She has no health insurance   Review of Systems  Constitutional: Negative.   HENT: Negative.   Eyes: Negative.   Respiratory: Negative.   Cardiovascular: Negative.   Gastrointestinal: Negative.   Genitourinary: Negative.   Musculoskeletal: Negative.   Neurological: Negative.   Psychiatric/Behavioral: Negative.        Objective:   Physical Exam  Constitutional: She appears well-developed and well-nourished.  Smells of tobacco  HENT:  Head: Normocephalic and atraumatic.  Right Ear: External ear normal.  Left Ear: External ear normal.  Nose: Nose normal.  Mouth/Throat: Oropharynx is clear and moist.  Eyes: EOM are normal. Pupils are equal, round, and reactive to light.  Neck: Normal range of motion. Neck supple. No thyromegaly present.  Cardiovascular: Normal rate, regular rhythm, normal heart sounds and intact distal pulses.  Exam reveals no gallop and no friction rub.   No murmur heard. Pulmonary/Chest: Effort normal and breath sounds normal.  Bilateral scars from previous total bilateral mastectomies 1994 no palpable masses  Abdominal: Soft.  Bowel sounds are normal. She exhibits no distension and no mass. There is no tenderness. There is no rebound.  Genitourinary: Vagina normal. Guaiac negative stool. No vaginal discharge found.  Musculoskeletal: Normal range of motion.  Lymphadenopathy:    She has no cervical adenopathy.  Neurological: She is alert. She has normal reflexes. No cranial nerve deficit. She exhibits normal muscle tone. Coordination normal.  Skin: Skin is warm and dry.  Total body skin exam normal  Psychiatric: She has a normal mood and affect. Her behavior is normal. Judgment and thought content normal.          Assessment & Plan:  Healthy female  Hypertension continue Tenormin 50 mg daily  Tobacco abuse begin smoking cessation program followup in one month  Bilateral mastectomies for breast cancer 1994  Recommend she try to get health insurance. She really needs to have a colonoscopy

## 2012-11-08 NOTE — Patient Instructions (Addendum)
Continue the Tenormin one tablet daily  Baby aspirin 1 daily  Begin the Chantix 1 mg,,,,,,,,,, one half tab every morning  Tapering program as outlined  Return in one month for followup

## 2012-12-09 ENCOUNTER — Ambulatory Visit: Payer: Self-pay | Admitting: Family Medicine

## 2013-01-04 ENCOUNTER — Encounter (HOSPITAL_COMMUNITY): Payer: Self-pay | Admitting: Emergency Medicine

## 2013-01-04 ENCOUNTER — Emergency Department (HOSPITAL_COMMUNITY)
Admission: EM | Admit: 2013-01-04 | Discharge: 2013-01-04 | Disposition: A | Discharge status: Home / Self Care | Payer: Self-pay | Source: Home / Self Care | Attending: Emergency Medicine | Admitting: Emergency Medicine

## 2013-01-04 DIAGNOSIS — B351 Tinea unguium: Secondary | ICD-10-CM

## 2013-01-04 MED ORDER — CEPHALEXIN 500 MG PO CAPS: 500.0000 mg | ORAL_CAPSULE | Freq: Three times a day (TID) | ORAL | Status: DC

## 2013-01-04 MED ORDER — HYDROCODONE-ACETAMINOPHEN 5-325 MG PO TABS: ORAL_TABLET | ORAL | Status: DC

## 2013-01-04 NOTE — ED Notes (Signed)
Right great toe pain , onset one month ago, worsened over the last few days.  Nail is white. Wearing shoes makes toe sore.

## 2013-01-04 NOTE — ED Provider Notes (Signed)
Chief Complaint:   Chief Complaint  Patient presents with  . Foot Pain    History of Present Illness:   Natalie Benson is a 62 year old female who has had a one-month history of pain and discoloration of the right great toenail. There is no purulent drainage. There is slight surrounding erythema. No fever or chills. She denies any trauma.  Review of Systems:  Other than noted above, the patient denies any of the following symptoms: Systemic:  No fevers, chills, or sweats.  No fatigue or tiredness. Musculoskeletal:  No joint pain, arthritis, bursitis, swelling, or back pain.  Neurological:  No muscular weakness, paresthesias.  PMFSH:  Past medical history, family history, social history, meds, and allergies were reviewed.  No history of gout.    Physical Exam:   Vital signs:  BP 152/77  Pulse 99  Temp(Src) 98.2 F (36.8 C) (Oral)  Resp 16  SpO2 99% Gen:  Alert and oriented times 3.  In no distress. Musculoskeletal:  Exam of the foot reveals the nail is thickened and has whitish discoloration. There is no purulent drainage. It is tender to palpation. She has slight erythema surrounding the nail.  Otherwise, all joints had a full a ROM with no swelling, bruising or deformity.  No edema, pulses full. Extremities were warm and pink.  Capillary refill was brisk.  Skin:  Clear, warm and dry.  No rash. Neuro:  Alert and oriented times 3.  Muscle strength was normal.  Sensation was intact to light touch.   Procedure Note:  Verbal informed consent was obtained from the patient.  Risks and benefits were outlined with the patient.  Patient understands and accepts these risks.  Identity of the patient was confirmed verbally and by armband.    Procedure was performed as follows:  The toe was prepped with Betadine and alcohol and anesthetized with a digital block with 5 mL of 2% Xylocaine without epinephrine. After satisfactory local anesthesia was was achieved, the nail was elevated with nail elevator  and was avulsed. The nailbed appeared normal without any evidence of purulent drainage. Bleeding was controlled with electrocautery. Antibiotic ointment and a dressing were applied.  Patient tolerated the procedure well without any immediate complications.   Assessment:  The encounter diagnosis was Onychomycosis.  The pain could be just some thickening of the nail or possibly from a mild infection. Patient was instructed to soak in Epsom salt water and given a prescription for antibiotics.  Plan:   1.  The following meds were prescribed:   Discharge Medication List as of 01/04/2013  4:39 PM    START taking these medications   Details  cephALEXin (KEFLEX) 500 MG capsule Take 1 capsule (500 mg total) by mouth 3 (three) times daily., Starting 01/04/2013, Until Discontinued, Normal    HYDROcodone-acetaminophen (NORCO/VICODIN) 5-325 MG per tablet 1 to 2 tabs every 4 to 6 hours as needed for pain., Print       2.  The patient was instructed in symptomatic care, including rest and activity, elevation, application of ice and compression.  Appropriate handouts were given. 3.  The patient was told to return if becoming worse in any way, if no better in 3 or 4 days, and given some red flag symptoms such as swelling or worsening pain that would indicate earlier return.   4.  The patient was told to follow up here as needed.      Reuben Likes, MD 01/04/13 2132

## 2013-12-14 ENCOUNTER — Telehealth: Payer: Self-pay | Admitting: Internal Medicine

## 2013-12-14 ENCOUNTER — Encounter: Payer: Self-pay | Admitting: Internal Medicine

## 2013-12-14 ENCOUNTER — Ambulatory Visit (INDEPENDENT_AMBULATORY_CARE_PROVIDER_SITE_OTHER): Payer: No Typology Code available for payment source | Admitting: Internal Medicine

## 2013-12-14 VITALS — BP 140/74 | HR 67 | Temp 98.2°F | Wt 169.2 lb

## 2013-12-14 DIAGNOSIS — N3000 Acute cystitis without hematuria: Secondary | ICD-10-CM

## 2013-12-14 DIAGNOSIS — R102 Pelvic and perineal pain: Secondary | ICD-10-CM

## 2013-12-14 DIAGNOSIS — M545 Low back pain, unspecified: Secondary | ICD-10-CM

## 2013-12-14 DIAGNOSIS — N949 Unspecified condition associated with female genital organs and menstrual cycle: Secondary | ICD-10-CM

## 2013-12-14 LAB — COMPREHENSIVE METABOLIC PANEL WITH GFR
ALT: 14 U/L (ref 0–35)
AST: 15 U/L (ref 0–37)
Albumin: 4.1 g/dL (ref 3.5–5.2)
Alkaline Phosphatase: 72 U/L (ref 39–117)
BUN: 14 mg/dL (ref 6–23)
CO2: 30 meq/L (ref 19–32)
Calcium: 9.1 mg/dL (ref 8.4–10.5)
Chloride: 103 meq/L (ref 96–112)
Creatinine, Ser: 0.8 mg/dL (ref 0.4–1.2)
GFR: 74.73 mL/min
Glucose, Bld: 89 mg/dL (ref 70–99)
Potassium: 4.3 meq/L (ref 3.5–5.1)
Sodium: 140 meq/L (ref 135–145)
Total Bilirubin: 0.5 mg/dL (ref 0.2–1.2)
Total Protein: 7.1 g/dL (ref 6.0–8.3)

## 2013-12-14 LAB — POCT URINALYSIS DIPSTICK
Bilirubin, UA: NEGATIVE
Glucose, UA: NEGATIVE
Ketones, UA: NEGATIVE
Nitrite, UA: NEGATIVE
Spec Grav, UA: 1.025
Urobilinogen, UA: 0.2
pH, UA: 6

## 2013-12-14 LAB — CBC
HEMATOCRIT: 40.4 % (ref 36.0–46.0)
Hemoglobin: 13.6 g/dL (ref 12.0–15.0)
MCHC: 33.5 g/dL (ref 30.0–36.0)
MCV: 92.6 fl (ref 78.0–100.0)
Platelets: 293 10*3/uL (ref 150.0–400.0)
RBC: 4.37 Mil/uL (ref 3.87–5.11)
RDW: 14.2 % (ref 11.5–15.5)
WBC: 12.3 10*3/uL — AB (ref 4.0–10.5)

## 2013-12-14 MED ORDER — CIPROFLOXACIN HCL 500 MG PO TABS: 500.0000 mg | ORAL_TABLET | Freq: Two times a day (BID) | ORAL | Status: DC

## 2013-12-14 NOTE — Telephone Encounter (Signed)
Original msg in result note

## 2013-12-14 NOTE — Progress Notes (Signed)
Subjective:    Patient ID: Natalie Benson, female    DOB: 1951-01-27, 63 y.o.   MRN: [id number]  HPI  Pt presents to the clinic today with c/o pain in her lower abdomen/pelvic area. She reports this started 3 weeks ago. She describes the pain as crampy. It does not radiate. She denies nausea or vomiting. It does not seem to be affected by food. She is have a BM every other day, which are normal. She denies blood in her stool. She has tried Ibuprofen without much relief. She denies urinary symptoms.  Additionally, she c/o low back pain. She reports that the pain has been constant for a few months. The pain does not radiate. She denies loss of bowel or bladder.  Review of Systems      Past Medical History  Diagnosis Date  . Hyperlipidemia   . Hypertension   . Cancer     breast  . Tobacco abuse   . PVD (peripheral vascular disease)     right carotid bruit  . GERD (gastroesophageal reflux disease)   . Colitis     collangenous    Current Outpatient Prescriptions  Medication Sig Dispense Refill  . aspirin 81 MG tablet Take 81 mg by mouth daily.      Marland Kitchen atenolol (TENORMIN) 50 MG tablet TAKE ONE TABLET BY MOUTH EVERY DAY. NEEDS OFFICE VISIT  100 tablet  3  . omeprazole (PRILOSEC) 40 MG capsule Take 40 mg by mouth daily.       No current facility-administered medications for this visit.    No Known Allergies  Family History  Problem Relation Age of Onset  . Congestive Heart Failure Father   . Cancer Mother     Gi Cancer  . Cancer Sister     breast  . COPD Brother   . Hypertension Brother     History   Social History  . Marital Status: Divorced    Spouse Name: N/A    Number of Children: N/A  . Years of Education: N/A   Occupational History  . Not on file.   Social History Main Topics  . Smoking status: Current Every Day Smoker -- 1.50 packs/day    Types: Cigarettes  . Smokeless tobacco: Never Used  . Alcohol Use: Yes     Comment: occasional--wine  . Drug Use:  No  . Sexual Activity: Not on file   Other Topics Concern  . Not on file   Social History Narrative  . No narrative on file     Constitutional: Denies fever, malaise, fatigue, headache or abrupt weight changes.  Gastrointestinal: Pt reports abdominal pain. Denies  bloating, constipation, diarrhea or blood in the stool.  GU: Denies urgency, frequency, pain with urination, burning sensation, blood in urine, odor or discharge. Musculoskeletal: Pt reports low back pain. Denies decrease in range of motion, difficulty with gait, or joint pain and swelling.   No other specific complaints in a complete review of systems (except as listed in HPI above).  Objective:   Physical Exam   BP 140/74  Pulse 67  Temp(Src) 98.2 F (36.8 C) (Oral)  Wt 169 lb 4 oz (76.771 kg)  SpO2 97% Wt Readings from Last 3 Encounters:  12/14/13 169 lb 4 oz (76.771 kg)  11/08/12 163 lb (73.936 kg)  11/03/11 153 lb (69.4 kg)    General: Appears her stated age, well developed, well nourished in NAD. Cardiovascular: Normal rate and rhythm. S1,S2 noted.  No murmur, rubs or  gallops noted. No JVD or BLE edema. No carotid bruits noted. Pulmonary/Chest: Normal effort and positive vesicular breath sounds. No respiratory distress. No wheezes, rales or ronchi noted.  Abdomen: Soft and tender in the RLQ/bladder. Normal bowel sounds, no bruits noted. No distention or masses noted. Liver, spleen and kidneys non palpable. No CVA tenderness. Musculoskeletal: Normal flexion and extension of the lower back. Strength 5/5 BLE. Negative straight leg raise.   BMET    Component Value Date/Time   NA 144 02/28/2010 0946   K 3.3* 02/28/2010 0946   CL 99 02/28/2010 0946   CO2 35* 02/28/2010 0946   GLUCOSE 85 02/28/2010 0946   BUN 9 02/28/2010 0946   CREATININE 0.7 02/28/2010 0946   CALCIUM 9.5 02/28/2010 0946   GFRNONAA 97.22 02/28/2010 0946   GFRAA 95 12/28/2007 0958    Lipid Panel     Component Value Date/Time   CHOL 143 02/28/2010 0946    TRIG 170.0* 02/28/2010 0946   HDL 40.20 02/28/2010 0946   CHOLHDL 4 02/28/2010 0946   VLDL 34.0 02/28/2010 0946   LDLCALC 69 02/28/2010 0946    CBC    Component Value Date/Time   WBC 9.9 02/28/2010 0946   RBC 4.65 02/28/2010 0946   HGB 15.0 02/28/2010 0946   HCT 43.8 02/28/2010 0946   PLT 279.0 02/28/2010 0946   MCV 94.2 02/28/2010 0946   MCHC 34.3 02/28/2010 0946   RDW 13.5 02/28/2010 0946   LYMPHSABS 2.7 02/28/2010 0946   MONOABS 0.5 02/28/2010 0946   EOSABS 0.4 02/28/2010 0946   BASOSABS 0.0 02/28/2010 0946    Hgb A1C No results found for this basename: HGBA1C        Assessment & Plan:   Low back pain/pelvic pain:  Urinalysis: large blood, mod leuks Will start Cipro BID x 5 days Not enoug urine to send culture Will also check CBC and CMET today  If no improvement by Monday, follow up with PCP

## 2013-12-14 NOTE — Telephone Encounter (Signed)
Patient returned your call.

## 2013-12-14 NOTE — Progress Notes (Signed)
Pre visit review using our clinic review tool, if applicable. No additional management support is needed unless otherwise documented below in the visit note. 

## 2013-12-14 NOTE — Patient Instructions (Addendum)
Pelvic Pain, Female Female pelvic pain can be caused by many different things and start from a variety of places. Pelvic pain refers to pain that is located in the lower half of the abdomen and between your hips. The pain may occur over a short period of time (acute) or may be reoccurring (chronic). The cause of pelvic pain may be related to disorders affecting the female reproductive organs (gynecologic), but it may also be related to the bladder, kidney stones, an intestinal complication, or muscle or skeletal problems. Getting help right away for pelvic pain is important, especially if there has been severe, sharp, or a sudden onset of unusual pain. It is also important to get help right away because some types of pelvic pain can be life threatening.  CAUSES  Below are only some of the causes of pelvic pain. The causes of pelvic pain can be in one of several categories.   Gynecologic.  Pelvic inflammatory disease.  Sexually transmitted infection.  Ovarian cyst or a twisted ovarian ligament (ovarian torsion).  Uterine lining that grows outside the uterus (endometriosis).  Fibroids, cysts, or tumors.  Ovulation.  Pregnancy.  Pregnancy that occurs outside the uterus (ectopic pregnancy).  Miscarriage.  Labor.  Abruption of the placenta or ruptured uterus.  Infection.  Uterine infection (endometritis).  Bladder infection.  Diverticulitis.  Miscarriage related to a uterine infection (septic abortion).  Bladder.  Inflammation of the bladder (cystitis).  Kidney stone(s).  Gastrointenstinal.  Constipation.  Diverticulitis.  Neurologic.  Trauma.  Feeling pelvic pain because of mental or emotional causes (psychosomatic).  Cancers of the bowel or pelvis. EVALUATION  Your caregiver will want to take a careful history of your concerns. This includes recent changes in your health, a careful gynecologic history of your periods (menses), and a sexual history. Obtaining  your family history and medical history is also important. Your caregiver may suggest a pelvic exam. A pelvic exam will help identify the location and severity of the pain. It also helps in the evaluation of which organ system may be involved. In order to identify the cause of the pelvic pain and be properly treated, your caregiver may order tests. These tests may include:   A pregnancy test.  Pelvic ultrasonography.  An X-ray exam of the abdomen.  A urinalysis or evaluation of vaginal discharge.  Blood tests. HOME CARE INSTRUCTIONS   Only take over-the-counter or prescription medicines for pain, discomfort, or fever as directed by your caregiver.   Rest as directed by your caregiver.   Eat a balanced diet.   Drink enough fluids to make your urine clear or pale yellow, or as directed.   Avoid sexual intercourse if it causes pain.   Apply warm or cold compresses to the lower abdomen depending on which one helps the pain.   Avoid stressful situations.   Keep a journal of your pelvic pain. Write down when it started, where the pain is located, and if there are things that seem to be associated with the pain, such as food or your menstrual cycle.  Follow up with your caregiver as directed.  SEEK MEDICAL CARE IF:  Your medicine does not help your pain.  You have abnormal vaginal discharge. SEEK IMMEDIATE MEDICAL CARE IF:   You have heavy bleeding from the vagina.   Your pelvic pain increases.   You feel lightheaded or faint.   You have chills.   You have pain with urination or blood in your urine.   You have uncontrolled  diarrhea or vomiting.   You have a fever or persistent symptoms for more than 3 days.  You have a fever and your symptoms suddenly get worse.   You are being physically or sexually abused.  MAKE SURE YOU:  Understand these instructions.  Will watch your condition.  Will get help if you are not doing well or get worse. Document  Released: 05/06/2004 Document Revised: 12/09/2011 Document Reviewed: 09/29/2011 [facility] Patient Information 2015 [place], [place]. This information is not intended to replace advice given to you by your health care Jyll Tomaro. Make sure you discuss any questions you have with your health care Carsen Machi.

## 2013-12-19 ENCOUNTER — Telehealth: Payer: Self-pay

## 2013-12-19 NOTE — Telephone Encounter (Signed)
Will see then. 

## 2013-12-19 NOTE — Telephone Encounter (Signed)
Pt was seen by Webb Silversmith NP on 12/14/13 with pelvic and back pain and advised when finished antibiotic if no improvement needed to schedule f/u appt for possible pelvic exam and [place]. Pt scheduled appt with Dr Darnell Level on 12/20/13 at 12 noon pt said symptoms are same as when seen on 12/14/13.. Advised pt if condition changed or worsened to go to UC> pt voiced understanding.

## 2013-12-20 ENCOUNTER — Encounter: Payer: Self-pay | Admitting: Family Medicine

## 2013-12-20 ENCOUNTER — Ambulatory Visit (INDEPENDENT_AMBULATORY_CARE_PROVIDER_SITE_OTHER)
Admission: RE | Admit: 2013-12-20 | Discharge: 2013-12-20 | Disposition: A | Discharge status: Home / Self Care | Payer: No Typology Code available for payment source | Source: Ambulatory Visit | Attending: Family Medicine | Admitting: Family Medicine

## 2013-12-20 ENCOUNTER — Ambulatory Visit (INDEPENDENT_AMBULATORY_CARE_PROVIDER_SITE_OTHER): Payer: No Typology Code available for payment source | Admitting: Family Medicine

## 2013-12-20 VITALS — BP 132/78 | HR 68 | Temp 98.2°F | Wt 170.2 lb

## 2013-12-20 DIAGNOSIS — F17200 Nicotine dependence, unspecified, uncomplicated: Secondary | ICD-10-CM

## 2013-12-20 DIAGNOSIS — R1031 Right lower quadrant pain: Secondary | ICD-10-CM

## 2013-12-20 DIAGNOSIS — M545 Low back pain, unspecified: Secondary | ICD-10-CM

## 2013-12-20 DIAGNOSIS — R109 Unspecified abdominal pain: Secondary | ICD-10-CM

## 2013-12-20 LAB — POCT URINALYSIS DIPSTICK
BILIRUBIN UA: NEGATIVE
Glucose, UA: NEGATIVE
Ketones, UA: NEGATIVE
Nitrite, UA: NEGATIVE
PH UA: 6.5
Protein, UA: NEGATIVE
SPEC GRAV UA: 1.01
UROBILINOGEN UA: 0.2

## 2013-12-20 MED ORDER — TAMSULOSIN HCL 0.4 MG PO CAPS: 0.4000 mg | ORAL_CAPSULE | Freq: Every day | ORAL | Status: DC

## 2013-12-20 MED ORDER — CYCLOBENZAPRINE HCL 5 MG PO TABS: 5.0000 mg | ORAL_TABLET | Freq: Two times a day (BID) | ORAL | Status: DC | PRN

## 2013-12-20 NOTE — Progress Notes (Signed)
BP 132/78  Pulse 68  Temp(Src) 98.2 F (36.8 C) (Oral)  Wt 170 lb 4 oz (77.225 kg)   CC: abd pain  Subjective:    Patient ID: Natalie Benson, female    DOB: 02/24/1951, 63 y.o.   MRN: [id number]  HPI: Natalie Benson is a 63 y.o. female presenting on 12/20/2013 for Abdominal Pain and Back Pain   H/o breast cancer s/p mastectomy 1994 bilaterally, HTN, current smoker.  See recent note at our office for details. Seen 12/14/2013 by Rollene Fare with dx UTI by UA but not enough urine sample to send culture.  Treated with cipro 500mg  bid x 5 days.  No better.  Reviewed labwork from that visit (normal CMP and CBC except for WBC 12.3) and UA (large blood, mod LE).  Describes 1 mo h/o lower right back pain, abdominal and pelvic pain. Pain seems to radiate into groin. Nauseated with pain. Awakens with pain, worse with position changes. "cant get comfortable". Increased urinary frequency and urgency. Denies inciting trauma or injury.  H/o collagenous colitis in chart but pt denies. Denies h/o colonoscopy.  Reviewing chart, has had colonoscopy 2002 with collagenous colitis by biopsy. Has had vaginal hysterectomy for benign reason (ovaries remain), cholecystectomy and appendectomy. Endorses h/o hematuria in past.  Reviewing chart, I don't see CT scan obtained fur eval of this or urology evaluation for this.  Denies fevers/chills, vomiting, no change with appetite or change with different foods. No urinary sxs - dysuria, gross hematuria. Denies diarrhea or constipation. No blood or mucous in stool. No midline back pain or shooting pain down leg.  Has not been able to go to work since last Tuesday. Works in [employer] 7 hr/day. No h/o kidney stones.  Smokes 1.5 ppd.  Relevant past medical, surgical, family and social history reviewed and updated as indicated.  Allergies and medications reviewed and updated. Current Outpatient Prescriptions on File Prior to Visit  Medication Sig  . aspirin 81 MG tablet Take 81 mg  by mouth daily.  Marland Kitchen atenolol (TENORMIN) 50 MG tablet TAKE ONE TABLET BY MOUTH EVERY DAY. NEEDS OFFICE VISIT  . omeprazole (PRILOSEC) 40 MG capsule Take 40 mg by mouth daily.   No current facility-administered medications on file prior to visit.    Review of Systems Per HPI unless specifically indicated above    Objective:    BP 132/78  Pulse 68  Temp(Src) 98.2 F (36.8 C) (Oral)  Wt 170 lb 4 oz (77.225 kg)  Physical Exam  Nursing note and vitals reviewed. Constitutional: She appears well-developed and well-nourished. No distress.  Mildly uncomfortable with position changes  HENT:  Mouth/Throat: Oropharynx is clear and moist. No oropharyngeal exudate.  Cardiovascular: Normal rate, regular rhythm, normal heart sounds and intact distal pulses.   No murmur heard. Pulmonary/Chest: Effort normal and breath sounds normal. No respiratory distress. She has no wheezes. She has no rales.  Abdominal: Soft. Normal appearance and bowel sounds are normal. She exhibits no distension and no mass. There is no hepatosplenomegaly. There is tenderness (mild) in the right lower quadrant and epigastric area. There is guarding (mild). There is no rigidity, no rebound and no CVA tenderness.  Mild discomfort with psoas  Musculoskeletal: She exhibits no edema.  Mild back discomfort with SLR No pain with hip flexion or abduction against resistance on right  Skin: Skin is warm and dry. No rash noted.   Results for orders placed in visit on 12/20/13  POCT URINALYSIS DIPSTICK  Result Value Ref Range   Color, UA Yellow     Clarity, UA Clear     Glucose, UA Negative     Bilirubin, UA Negative     Ketones, UA Negative     Spec Grav, UA 1.010     Blood, UA Large     pH, UA 6.5     Protein, UA Negative     Urobilinogen, UA 0.2     Nitrite, UA Negative     Leukocytes, UA Trace        Assessment & Plan:   Problem List Items Addressed This Visit   TOBACCO ABUSE     Continue to encourage cessation.  precontemplative.    RLQ abdominal pain - Primary     Recent WBC elevated, s/p 5d cipro course without improvement. Hematuria on UA but micro RBC/hpf WNL. Check UCx today. Suspicious for nephrolithiasis, but must also consider other GU causes in smoker. Not consistent with collagenous colitis as no BM changes ?MSK cause like groin strain or lumbar radiculopathy. Check KUB today to eval for R kidney stone - negative on my read. Will treat presumptively as kidney stone with flomax 10 d course and add muscle relaxant for possible MSK strain etiology - pt requests med trial prior to further imaging. Advised update [place] if sxs persist or fail to improve, return sooner if any worsening of pain. Low threshold for CT abd/pelvis with and without contrast to eval kidney stone or other GU etiology.    Relevant Orders      DG Abd 1 View      Urine culture    Other Visit Diagnoses   Abdominal pain, other specified site        Relevant Orders       POCT Urinalysis Dipstick (Completed)       Urine culture    Midline low back pain without sciatica        Relevant Medications       cyclobenzaprine (FLEXERIL) tablet    Other Relevant Orders       POCT Urinalysis Dipstick (Completed)        Follow up plan: Return if symptoms worsen or fail to improve.

## 2013-12-20 NOTE — Progress Notes (Signed)
Pre visit review using our clinic review tool, if applicable. No additional management support is needed unless otherwise documented below in the visit note. 

## 2013-12-20 NOTE — Assessment & Plan Note (Signed)
Continue to encourage cessation. precontemplative. 

## 2013-12-20 NOTE — Assessment & Plan Note (Addendum)
Recent WBC elevated, s/p 5d cipro course without improvement. Hematuria on UA but micro RBC/hpf WNL. Check UCx today. Suspicious for nephrolithiasis, but must also consider other GU causes in smoker. Not consistent with collagenous colitis as no BM changes ?MSK cause like groin strain or lumbar radiculopathy. Check KUB today to eval for R kidney stone - negative on my read. Will treat presumptively as kidney stone with flomax 10 d course and add muscle relaxant for possible MSK strain etiology - pt requests med trial prior to further imaging. Advised update [place] if sxs persist or fail to improve, return sooner if any worsening of pain. Low threshold for CT abd/pelvis with and without contrast to eval kidney stone or other GU etiology.

## 2013-12-20 NOTE — Patient Instructions (Signed)
For possible kidney stone - start flomax 0.4mg  once daily for next 10 days For possible pulled muscle - start flexeril 5mg  twice daily with food (sedation precautions). Let us know if not better for CT scan.

## 2013-12-21 LAB — URINE CULTURE
Colony Count: NO GROWTH
Organism ID, Bacteria: NO GROWTH

## 2013-12-22 ENCOUNTER — Ambulatory Visit (INDEPENDENT_AMBULATORY_CARE_PROVIDER_SITE_OTHER)
Admission: RE | Admit: 2013-12-22 | Discharge: 2013-12-22 | Disposition: A | Discharge status: Home / Self Care | Payer: No Typology Code available for payment source | Source: Ambulatory Visit | Attending: Family Medicine | Admitting: Family Medicine

## 2013-12-22 ENCOUNTER — Other Ambulatory Visit: Payer: Self-pay | Admitting: Family Medicine

## 2013-12-22 DIAGNOSIS — R1031 Right lower quadrant pain: Secondary | ICD-10-CM

## 2013-12-22 MED ORDER — IOHEXOL 300 MG/ML  SOLN
100.0000 mL | Freq: Once | INTRAMUSCULAR | Status: AC | PRN
  Administered 2013-12-22: 100 mL via INTRAVENOUS

## 2014-01-06 ENCOUNTER — Ambulatory Visit: Payer: Self-pay | Admitting: Internal Medicine

## 2014-02-07 ENCOUNTER — Other Ambulatory Visit: Payer: Self-pay

## 2014-02-07 DIAGNOSIS — I10 Essential (primary) hypertension: Secondary | ICD-10-CM

## 2014-02-07 MED ORDER — ATENOLOL 50 MG PO TABS: ORAL_TABLET | ORAL | Status: DC

## 2014-02-07 NOTE — Telephone Encounter (Signed)
Pt out of atenolol and pt already scheduled 02/13/14 for CPX. Refill atenolol #30 to [facility]. Pt notified and will keep CPX appt.

## 2014-02-13 ENCOUNTER — Telehealth: Payer: Self-pay | Admitting: Family Medicine

## 2014-02-13 ENCOUNTER — Ambulatory Visit (INDEPENDENT_AMBULATORY_CARE_PROVIDER_SITE_OTHER): Payer: No Typology Code available for payment source | Admitting: Internal Medicine

## 2014-02-13 ENCOUNTER — Encounter: Payer: Self-pay | Admitting: Internal Medicine

## 2014-02-13 VITALS — Ht 65.25 in | Wt 170.0 lb

## 2014-02-13 DIAGNOSIS — Z0000 Encounter for general adult medical examination without abnormal findings: Secondary | ICD-10-CM

## 2014-02-13 DIAGNOSIS — R109 Unspecified abdominal pain: Secondary | ICD-10-CM

## 2014-02-13 DIAGNOSIS — R7989 Other specified abnormal findings of blood chemistry: Secondary | ICD-10-CM

## 2014-02-13 DIAGNOSIS — G8929 Other chronic pain: Secondary | ICD-10-CM

## 2014-02-13 NOTE — Telephone Encounter (Signed)
Relevant patient education assigned to patient using Emmi. ° °

## 2014-02-13 NOTE — Progress Notes (Signed)
Pre visit review using our clinic review tool, if applicable. No additional management support is needed unless otherwise documented below in the visit note. 

## 2014-02-13 NOTE — Patient Instructions (Addendum)

## 2014-02-13 NOTE — Progress Notes (Signed)
Subjective:    Patient ID: Natalie Benson, female    DOB: 11-20-1950, 63 y.o.   MRN: [id number]  HPI  Pt presents to the clinic today for her annual exam. She continues to have pain in her lower abdomen. CT scan of the abdomen was normal. The flexeril did not help her pain. The pain occurs shortly after she eats. It does come and go.  Flu: never Tetanus: 2007 Zostovax: never Pap Smear: > 2 years ago Mammogram: 1995, bilateral mastectomy Colon Screening: 2002 Eye Doctor: as needed Dentist: biannually  Review of Systems  Past Medical History  Diagnosis Date  . Hyperlipidemia   . Hypertension   . Cancer     breast  . Tobacco abuse   . PVD (peripheral vascular disease)     right carotid bruit  . GERD (gastroesophageal reflux disease)   . Colitis     collagenous    Current Outpatient Prescriptions  Medication Sig Dispense Refill  . aspirin 81 MG tablet Take 81 mg by mouth daily.      Marland Kitchen atenolol (TENORMIN) 50 MG tablet TAKE ONE TABLET BY MOUTH EVERY DAY.  30 tablet  0  . omeprazole (PRILOSEC) 40 MG capsule Take 40 mg by mouth daily.       No current facility-administered medications for this visit.    No Known Allergies  Family History  Problem Relation Age of Onset  . Congestive Heart Failure Father   . Cancer Mother     Gi Cancer  . Cancer Sister     breast  . COPD Brother   . Hypertension Brother     History   Social History  . Marital Status: Divorced    Spouse Name: N/A    Number of Children: N/A  . Years of Education: N/A   Occupational History  . Not on file.   Social History Main Topics  . Smoking status: Current Every Day Smoker -- 1.50 packs/day    Types: Cigarettes  . Smokeless tobacco: Never Used  . Alcohol Use: Yes     Comment: occasional--wine  . Drug Use: No  . Sexual Activity: Not on file   Other Topics Concern  . Not on file   Social History Narrative  . No narrative on file     Constitutional: Denies fever, malaise,  fatigue, headache or abrupt weight changes.  HEENT: Denies eye pain, eye redness, ear pain, ringing in the ears, wax buildup, runny nose, nasal congestion, bloody nose, or sore throat. Respiratory: Denies difficulty breathing, shortness of breath, cough or sputum production.   Cardiovascular: Denies chest pain, chest tightness, palpitations or swelling in the hands or feet.  Gastrointestinal: Pt reports abdominal pain. Denies bloating, constipation, diarrhea or blood in the stool.  GU: Denies urgency, frequency, pain with urination, burning sensation, blood in urine, odor or discharge. Musculoskeletal: Pt reports low back pain. Denies decrease in range of motion, difficulty with gait, or joint pain and swelling.  Skin: Denies redness, rashes, lesions or ulcercations.  Neurological: Denies dizziness, difficulty with memory, difficulty with speech or problems with balance and coordination.   No other specific complaints in a complete review of systems (except as listed in HPI above).     Objective:   Physical Exam   Ht 5' 5.25" (1.657 m)  Wt 170 lb (77.111 kg)  BMI 28.08 kg/m2 Wt Readings from Last 3 Encounters:  02/13/14 170 lb (77.111 kg)  12/20/13 170 lb 4 oz (77.225 kg)  12/14/13 169  lb 4 oz (76.771 kg)    General: Appears her stated age, well developed, well nourished in NAD. Skin: Warm, dry and intact. No rashes, lesions or ulcerations noted. HEENT: Head: normal shape and size; Eyes: sclera white, no icterus, conjunctiva pink, PERRLA and EOMs intact; Ears: Tm's gray and intact, normal light reflex; Nose: mucosa pink and moist, septum midline; Throat/Mouth: Teeth present, mucosa pink and moist, no exudate, lesions or ulcerations noted.  Cardiovascular: Normal rate and rhythm. S1,S2 noted.  No murmur, rubs or gallops noted. No JVD or BLE edema. No carotid bruits noted. Pulmonary/Chest: Normal effort and positive vesicular breath sounds. No respiratory distress. No wheezes, rales or  ronchi noted.  Abdomen: Soft and generally tender. Normal bowel sounds, no bruits noted. No distention or masses noted. Liver, spleen and kidneys non palpable. Musculoskeletal: Normal range of motion. No signs of joint swelling. No difficulty with gait.   BMET    Component Value Date/Time   NA 140 12/14/2013 1449   K 4.3 12/14/2013 1449   CL 103 12/14/2013 1449   CO2 30 12/14/2013 1449   GLUCOSE 89 12/14/2013 1449   BUN 14 12/14/2013 1449   CREATININE 0.8 12/14/2013 1449   CALCIUM 9.1 12/14/2013 1449   GFRNONAA 97.22 02/28/2010 0946   GFRAA 95 12/28/2007 0958    Lipid Panel     Component Value Date/Time   CHOL 143 02/28/2010 0946   TRIG 170.0* 02/28/2010 0946   HDL 40.20 02/28/2010 0946   CHOLHDL 4 02/28/2010 0946   VLDL 34.0 02/28/2010 0946   LDLCALC 69 02/28/2010 0946    CBC    Component Value Date/Time   WBC 12.3* 12/14/2013 1449   RBC 4.37 12/14/2013 1449   HGB 13.6 12/14/2013 1449   HCT 40.4 12/14/2013 1449   PLT 293.0 12/14/2013 1449   MCV 92.6 12/14/2013 1449   MCHC 33.5 12/14/2013 1449   RDW 14.2 12/14/2013 1449   LYMPHSABS 2.7 02/28/2010 0946   MONOABS 0.5 02/28/2010 0946   EOSABS 0.4 02/28/2010 0946   BASOSABS 0.0 02/28/2010 0946    Hgb A1C No results found for this basename: HGBA1C        Assessment & Plan:   Preventative Health Maintenance:  Encouraged her to work on diet and exercise Will check cbc, cmet and lipids today  Chronic abdominal pain:  CT scan reviewed Stop flexeril as this did not help Increase prilosec to BID x 2 weeks If no improvement, will refer to GI  RTC as needed

## 2014-02-14 LAB — COMPREHENSIVE METABOLIC PANEL
ALT: 13 U/L (ref 0–35)
AST: 20 U/L (ref 0–37)
Albumin: 4.1 g/dL (ref 3.5–5.2)
Alkaline Phosphatase: 68 U/L (ref 39–117)
BUN: 10 mg/dL (ref 6–23)
CO2: 31 meq/L (ref 19–32)
CREATININE: 0.9 mg/dL (ref 0.4–1.2)
Calcium: 9.4 mg/dL (ref 8.4–10.5)
Chloride: 103 mEq/L (ref 96–112)
GFR: 68.85 mL/min (ref >60.00)
GLUCOSE: 83 mg/dL (ref 70–99)
Potassium: 4 mEq/L (ref 3.5–5.1)
Sodium: 141 mEq/L (ref 135–145)
TOTAL PROTEIN: 7.3 g/dL (ref 6.0–8.3)
Total Bilirubin: 0.3 mg/dL (ref 0.2–1.2)

## 2014-02-14 LAB — LIPID PANEL
Cholesterol: 283 mg/dL — ABNORMAL HIGH (ref 0–200)
HDL: 40.6 mg/dL (ref >39.00)
NonHDL: 242.4
TRIGLYCERIDES: 242 mg/dL — AB (ref 0.0–149.0)
Total CHOL/HDL Ratio: 7
VLDL: 48.4 mg/dL — ABNORMAL HIGH (ref 0.0–40.0)

## 2014-02-14 LAB — LDL CHOLESTEROL, DIRECT: LDL DIRECT: 222.8 mg/dL

## 2014-02-14 LAB — TSH: TSH: 0.59 u[IU]/mL (ref 0.35–4.50)

## 2014-02-14 LAB — CBC
HEMATOCRIT: 37.7 % (ref 36.0–46.0)
Hemoglobin: 12.7 g/dL (ref 12.0–15.0)
MCHC: 33.7 g/dL (ref 30.0–36.0)
MCV: 93.3 fl (ref 78.0–100.0)
Platelets: 319 10*3/uL (ref 150.0–400.0)
RBC: 4.04 Mil/uL (ref 3.87–5.11)
RDW: 14.1 % (ref 11.5–15.5)
WBC: 10.9 10*3/uL — AB (ref 4.0–10.5)

## 2014-02-14 NOTE — Progress Notes (Signed)
Natalie Benson set up an new pt to establish visit with you when she saw you for an acute visit back in June.  When the automated system called to remind her she cancelled.  Natalie Benson saw the establish care appt but didn't see the status which was Cancel.  She apologized for this mistake.  I also let her know that Dr. Sherren Mocha is still marked as the PCP at the top.

## 2014-02-17 ENCOUNTER — Other Ambulatory Visit: Payer: Self-pay

## 2014-02-17 MED ORDER — SIMVASTATIN 20 MG PO TABS: 20.0000 mg | ORAL_TABLET | Freq: Every day | ORAL | Status: DC

## 2014-02-17 NOTE — Addendum Note (Signed)
Addended by: Lurlean Nanny on: 02/17/2014 09:35 AM   Modules accepted: Orders

## 2014-02-21 NOTE — Telephone Encounter (Signed)
Opened in error

## 2014-03-07 ENCOUNTER — Other Ambulatory Visit: Payer: Self-pay

## 2014-03-07 DIAGNOSIS — I10 Essential (primary) hypertension: Secondary | ICD-10-CM

## 2014-03-07 NOTE — Telephone Encounter (Signed)
Pt left v/m requesting refill atenolol to [facility]. Pt was seen by Webb Silversmith NP on 02/13/14 but Dr Sherren Mocha is listed as PCP.Please advise.

## 2014-03-07 NOTE — Telephone Encounter (Signed)
I have not taken over this patients care, please forward to Dr. Sherren Mocha

## 2014-03-08 ENCOUNTER — Other Ambulatory Visit: Payer: Self-pay | Admitting: Internal Medicine

## 2014-03-08 DIAGNOSIS — I10 Essential (primary) hypertension: Secondary | ICD-10-CM

## 2014-03-08 MED ORDER — ATENOLOL 50 MG PO TABS: ORAL_TABLET | ORAL | Status: DC

## 2014-03-08 NOTE — Addendum Note (Signed)
Addended by: TODD, JEFFREY A on: 03/08/2014 11:07 AM   Modules accepted: Orders

## 2014-03-08 NOTE — Telephone Encounter (Signed)
Please see note from [place] at bottom of note on 8/24. Pt has not transferred her care to me. She has just been coming to see ever since I saw her for an acute visit.

## 2014-03-08 NOTE — Telephone Encounter (Addendum)
Pt notified as instructed; pt said she had spoken with Dr Honor Junes office and was advised that Dr Sherren Mocha was no longer pts PCP and pt said she had discussed with Webb Silversmith NP at 02/13/14 visit about taking over her care. Pt request atenolol to [facility] [address]. Pt request cb. (pt only has one pill left).Please advise.

## 2014-03-08 NOTE — Telephone Encounter (Signed)
Pt scheduled 03/31/14 to xfer from Dr. Sherren Mocha.   Pt has now also mentioned that she has not had pap done 5-6 years and needs one as well as a chest x-ray due to having a double mastectomy.   Pt is having back and stomach pain and family members have mentioned to her that she might need and MRI. I informed her this could be discussed at her visit.   Please refill BP medications to [facility].  If I need to move the appointment for any reason, please let me know and I will call pt.  Thanks

## 2014-03-09 NOTE — Telephone Encounter (Signed)
i sent this is yeserday

## 2014-03-09 NOTE — Telephone Encounter (Signed)
Please refer to msg below 

## 2014-03-09 NOTE — Telephone Encounter (Signed)
Well i mean now she has an est care appt with you so that she can become your pt, so i did not know if you would fill medication based on that or not--please advise

## 2014-03-09 NOTE — Telephone Encounter (Signed)
Yes, she does have an est care appt with me, and I told her I would send in her medicine. I sent it in yesterday but not sure what pharmacy I sent it to. I also gave her 5 refills

## 2014-03-31 ENCOUNTER — Encounter: Payer: Self-pay | Admitting: Internal Medicine

## 2014-03-31 ENCOUNTER — Ambulatory Visit (INDEPENDENT_AMBULATORY_CARE_PROVIDER_SITE_OTHER): Payer: No Typology Code available for payment source | Admitting: Internal Medicine

## 2014-03-31 ENCOUNTER — Ambulatory Visit (INDEPENDENT_AMBULATORY_CARE_PROVIDER_SITE_OTHER)
Admission: RE | Admit: 2014-03-31 | Discharge: 2014-03-31 | Disposition: A | Discharge status: Home / Self Care | Payer: No Typology Code available for payment source | Source: Ambulatory Visit | Attending: Internal Medicine | Admitting: Internal Medicine

## 2014-03-31 ENCOUNTER — Other Ambulatory Visit: Payer: No Typology Code available for payment source

## 2014-03-31 VITALS — BP 134/86 | HR 72 | Temp 98.5°F | Ht 65.25 in | Wt 172.5 lb

## 2014-03-31 DIAGNOSIS — R140 Abdominal distension (gaseous): Secondary | ICD-10-CM

## 2014-03-31 DIAGNOSIS — E785 Hyperlipidemia, unspecified: Secondary | ICD-10-CM | POA: Insufficient documentation

## 2014-03-31 DIAGNOSIS — F17200 Nicotine dependence, unspecified, uncomplicated: Secondary | ICD-10-CM

## 2014-03-31 DIAGNOSIS — Z720 Tobacco use: Secondary | ICD-10-CM

## 2014-03-31 LAB — LIPID PANEL
CHOL/HDL RATIO: 5
Cholesterol: 202 mg/dL — ABNORMAL HIGH (ref 0–200)
HDL: 37.5 mg/dL — AB (ref >39.00)
LDL CALC: 129 mg/dL — AB (ref 0–99)
NonHDL: 164.5
TRIGLYCERIDES: 177 mg/dL — AB (ref 0.0–149.0)
VLDL: 35.4 mg/dL (ref 0.0–40.0)

## 2014-03-31 LAB — COMPREHENSIVE METABOLIC PANEL
ALBUMIN: 3.6 g/dL (ref 3.5–5.2)
ALK PHOS: 73 U/L (ref 39–117)
ALT: 12 U/L (ref 0–35)
AST: 13 U/L (ref 0–37)
BUN: 12 mg/dL (ref 6–23)
CO2: 26 mEq/L (ref 19–32)
Calcium: 9.1 mg/dL (ref 8.4–10.5)
Chloride: 105 mEq/L (ref 96–112)
Creatinine, Ser: 0.8 mg/dL (ref 0.4–1.2)
GFR: 75.73 mL/min (ref >60.00)
Glucose, Bld: 93 mg/dL (ref 70–99)
Potassium: 3.8 mEq/L (ref 3.5–5.1)
Sodium: 138 mEq/L (ref 135–145)
Total Bilirubin: 0.3 mg/dL (ref 0.2–1.2)
Total Protein: 7.4 g/dL (ref 6.0–8.3)

## 2014-03-31 MED ORDER — PANTOPRAZOLE SODIUM 40 MG PO TBEC: 40.0000 mg | DELAYED_RELEASE_TABLET | Freq: Every day | ORAL | Status: AC

## 2014-03-31 NOTE — Assessment & Plan Note (Signed)
Will repeat lipid profile and CMET today Continue zocor Info given on a low fat diet

## 2014-03-31 NOTE — Progress Notes (Signed)
Subjective:    Patient ID: Natalie Benson, female    DOB: 03-21-1951, 64 y.o.   MRN: [id number]  HPI  Pt presents to the clinic today with multiple complaints:  1- She continues to have epigastric abdominal pain and bloating. She reports this is worse after she eats. She denies nausea or vomiting. She will occasionally have blood in her stool but reports that this happens every now and again due to hemorrhoids. She has been on prilosec but it does not provide any relief. CT scan of the abdomen was done 11/2012 and was normal. She is not sure why she continues to have pain. She reports her mother does have a history of a stomach tumor and concerned that she may have some type of cancer as well. She denies fever, chills or weight loss.  2- She did start the zocor for high cholesterol. She needs her lipid profile rechecked today.  3- She would like to know why I did not do a chest xray at her physical. She reports Dr. Sherren Mocha did a chest xray every year with her physical exam given her history of smoking.  4- She needs all her medications refilled. She would like a 90 day supply of all of her medications.  Review of Systems  Past Medical History  Diagnosis Date  . Hyperlipidemia   . Hypertension   . Cancer     breast  . Tobacco abuse   . PVD (peripheral vascular disease)     right carotid bruit  . GERD (gastroesophageal reflux disease)   . Colitis     collagenous    Current Outpatient Prescriptions  Medication Sig Dispense Refill  . aspirin 81 MG tablet Take 81 mg by mouth daily.      Marland Kitchen atenolol (TENORMIN) 50 MG tablet TAKE ONE TABLET BY MOUTH EVERY DAY.  30 tablet  5  . simvastatin (ZOCOR) 20 MG tablet Take 1 tablet (20 mg total) by mouth daily.  30 tablet  2  . pantoprazole (PROTONIX) 40 MG tablet Take 1 tablet (40 mg total) by mouth daily.  30 tablet  0   No current facility-administered medications for this visit.    No Known Allergies  Family History  Problem Relation Age  of Onset  . Congestive Heart Failure Father   . Cancer Mother     Gi Cancer  . Cancer Sister     breast  . COPD Brother   . Hypertension Brother     History   Social History  . Marital Status: Divorced    Spouse Name: N/A    Number of Children: N/A  . Years of Education: N/A   Occupational History  . Not on file.   Social History Main Topics  . Smoking status: Current Every Day Smoker -- 1.50 packs/day    Types: Cigarettes  . Smokeless tobacco: Never Used  . Alcohol Use: Yes     Comment: occasional--wine  . Drug Use: No  . Sexual Activity: Not on file   Other Topics Concern  . Not on file   Social History Narrative  . No narrative on file     Constitutional: Denies fever, malaise, fatigue, headache or abrupt weight changes.  Respiratory: Denies difficulty breathing, shortness of breath, cough or sputum production.   Cardiovascular: Denies chest pain, chest tightness, palpitations or swelling in the hands or feet.  Gastrointestinal: Pt reports epigastric pain and bloating. Denies constipation, diarrhea or blood in the stool.   No other  specific complaints in a complete review of systems (except as listed in HPI above).     Objective:   Physical Exam   BP 134/86  Pulse 72  Temp(Src) 98.5 F (36.9 C) (Oral)  Ht 5' 5.25" (1.657 m)  Wt 172 lb 8 oz (78.245 kg)  BMI 28.50 kg/m2  SpO2 97% Wt Readings from Last 3 Encounters:  03/31/14 172 lb 8 oz (78.245 kg)  02/13/14 170 lb (77.111 kg)  12/20/13 170 lb 4 oz (77.225 kg)    General: Appears her stated age, well developed, well nourished in NAD. Cardiovascular: Normal rate and rhythm. S1,S2 noted.  No murmur, rubs or gallops noted.  Pulmonary/Chest: Normal effort and positive vesicular breath sounds. No respiratory distress. No wheezes, rales or ronchi noted.  Abdomen: Soft and nontender. Normal bowel sounds, no bruits noted. No distention or masses noted. Liver, spleen and kidneys non palpable.   BMET      Component Value Date/Time   NA 141 02/13/2014 1451   K 4.0 02/13/2014 1451   CL 103 02/13/2014 1451   CO2 31 02/13/2014 1451   GLUCOSE 83 02/13/2014 1451   BUN 10 02/13/2014 1451   CREATININE 0.9 02/13/2014 1451   CALCIUM 9.4 02/13/2014 1451   GFRNONAA 97.22 02/28/2010 0946   GFRAA 95 12/28/2007 0958    Lipid Panel     Component Value Date/Time   CHOL 283* 02/13/2014 1451   TRIG 242.0* 02/13/2014 1451   HDL 40.60 02/13/2014 1451   CHOLHDL 7 02/13/2014 1451   VLDL 48.4* 02/13/2014 1451   LDLCALC 69 02/28/2010 0946    CBC    Component Value Date/Time   WBC 10.9* 02/13/2014 1451   RBC 4.04 02/13/2014 1451   HGB 12.7 02/13/2014 1451   HCT 37.7 02/13/2014 1451   PLT 319.0 02/13/2014 1451   MCV 93.3 02/13/2014 1451   MCHC 33.7 02/13/2014 1451   RDW 14.1 02/13/2014 1451   LYMPHSABS 2.7 02/28/2010 0946   MONOABS 0.5 02/28/2010 0946   EOSABS 0.4 02/28/2010 0946   BASOSABS 0.0 02/28/2010 0946    Hgb A1C No results found for this basename: HGBA1C        Assessment & Plan:   Epigastric abdominal pain and bloating:   Prilosec BID dosing did not help Stop prilosec, start protonix If no improvement in 2-4 weeks, will refer to GI  Smoker:  Will get chest xray to compare to one from 2011 Advised her that routine chest xrays is not something that I would be doing yearly Discussed CT scan for lung cancer screening- will hold off for now

## 2014-03-31 NOTE — Patient Instructions (Signed)
Fat and Cholesterol Control Diet Fat and cholesterol levels in your blood and organs are influenced by your diet. High levels of fat and cholesterol may lead to diseases of the heart, small and large blood vessels, gallbladder, liver, and pancreas. CONTROLLING FAT AND CHOLESTEROL WITH DIET Although exercise and lifestyle factors are important, your diet is key. That is because certain foods are known to raise cholesterol and others to lower it. The goal is to balance foods for their effect on cholesterol and more importantly, to replace saturated and trans fat with other types of fat, such as monounsaturated fat, polyunsaturated fat, and omega-3 fatty acids. On average, a person should consume no more than 15 to 17 g of saturated fat daily. Saturated and trans fats are considered "bad" fats, and they will raise LDL cholesterol. Saturated fats are primarily found in animal products such as meats, butter, and cream. However, that does not mean you need to give up all your favorite foods. Today, there are good tasting, low-fat, low-cholesterol substitutes for most of the things you like to eat. Choose low-fat or nonfat alternatives. Choose round or loin cuts of red meat. These types of cuts are lowest in fat and cholesterol. Chicken (without the skin), fish, veal, and ground turkey breast are great choices. Eliminate fatty meats, such as hot dogs and salami. Even shellfish have little or no saturated fat. Have a 3 oz (85 g) portion when you eat lean meat, poultry, or fish. Trans fats are also called "partially hydrogenated oils." They are oils that have been scientifically manipulated so that they are solid at room temperature resulting in a longer shelf life and improved taste and texture of foods in which they are added. Trans fats are found in stick margarine, some tub margarines, cookies, crackers, and baked goods.  When baking and cooking, oils are a great substitute for butter. The monounsaturated oils are  especially beneficial since it is believed they lower LDL and raise HDL. The oils you should avoid entirely are saturated tropical oils, such as coconut and palm.  Remember to eat a lot from food groups that are naturally free of saturated and trans fat, including fish, fruit, vegetables, beans, grains (barley, rice, couscous, bulgur wheat), and pasta (without cream sauces).  IDENTIFYING FOODS THAT LOWER FAT AND CHOLESTEROL  Soluble fiber may lower your cholesterol. This type of fiber is found in fruits such as apples, vegetables such as broccoli, potatoes, and carrots, legumes such as beans, peas, and lentils, and grains such as barley. Foods fortified with plant sterols (phytosterol) may also lower cholesterol. You should eat at least 2 g per day of these foods for a cholesterol lowering effect.  Read package labels to identify low-saturated fats, trans fat free, and low-fat foods at the supermarket. Select cheeses that have only 2 to 3 g saturated fat per ounce. Use a heart-healthy tub margarine that is free of trans fats or partially hydrogenated oil. When buying baked goods (cookies, crackers), avoid partially hydrogenated oils. Breads and muffins should be made from whole grains (whole-wheat or whole oat flour, instead of "flour" or "enriched flour"). Buy non-creamy canned soups with reduced salt and no added fats.  FOOD PREPARATION TECHNIQUES  Never deep-fry. If you must fry, either stir-fry, which uses very little fat, or use non-stick cooking sprays. When possible, broil, bake, or roast meats, and steam vegetables. Instead of putting butter or margarine on vegetables, use lemon and herbs, applesauce, and cinnamon (for squash and sweet potatoes). Use nonfat   yogurt, salsa, and low-fat dressings for salads.  LOW-SATURATED FAT / LOW-FAT FOOD SUBSTITUTES Meats / Saturated Fat (g)  Avoid: Steak, marbled (3 oz/85 g) / 11 g  Choose: Steak, lean (3 oz/85 g) / 4 g  Avoid: Hamburger (3 oz/85 g) / 7  g  Choose: Hamburger, lean (3 oz/85 g) / 5 g  Avoid: Ham (3 oz/85 g) / 6 g  Choose: Ham, lean cut (3 oz/85 g) / 2.4 g  Avoid: Chicken, with skin, dark meat (3 oz/85 g) / 4 g  Choose: Chicken, skin removed, dark meat (3 oz/85 g) / 2 g  Avoid: Chicken, with skin, light meat (3 oz/85 g) / 2.5 g  Choose: Chicken, skin removed, light meat (3 oz/85 g) / 1 g Dairy / Saturated Fat (g)  Avoid: Whole milk (1 cup) / 5 g  Choose: Low-fat milk, 2% (1 cup) / 3 g  Choose: Low-fat milk, 1% (1 cup) / 1.5 g  Choose: Skim milk (1 cup) / 0.3 g  Avoid: Hard cheese (1 oz/28 g) / 6 g  Choose: Skim milk cheese (1 oz/28 g) / 2 to 3 g  Avoid: Cottage cheese, 4% fat (1 cup) / 6.5 g  Choose: Low-fat cottage cheese, 1% fat (1 cup) / 1.5 g  Avoid: Ice cream (1 cup) / 9 g  Choose: Sherbet (1 cup) / 2.5 g  Choose: Nonfat frozen yogurt (1 cup) / 0.3 g  Choose: Frozen fruit bar / trace  Avoid: Whipped cream (1 tbs) / 3.5 g  Choose: Nondairy whipped topping (1 tbs) / 1 g Condiments / Saturated Fat (g)  Avoid: Mayonnaise (1 tbs) / 2 g  Choose: Low-fat mayonnaise (1 tbs) / 1 g  Avoid: Butter (1 tbs) / 7 g  Choose: Extra light margarine (1 tbs) / 1 g  Avoid: Coconut oil (1 tbs) / 11.8 g  Choose: Olive oil (1 tbs) / 1.8 g  Choose: Corn oil (1 tbs) / 1.7 g  Choose: Safflower oil (1 tbs) / 1.2 g  Choose: Sunflower oil (1 tbs) / 1.4 g  Choose: Soybean oil (1 tbs) / 2.4 g  Choose: Canola oil (1 tbs) / 1 g Document Released: 06/09/2005 Document Revised: 10/04/2012 Document Reviewed: 09/07/2013 Exit[employer] Patient Information 2015 [employer]. This information is not intended to replace advice given to you by your health care provider. Make sure you discuss any questions you have with your health care provider.  

## 2014-04-07 ENCOUNTER — Telehealth: Payer: Self-pay

## 2014-04-07 NOTE — Telephone Encounter (Signed)
Pt left v/m that needs refills on all pts meds. Spoke with [place] [facility] and all refills available. Pt notified by phone v/m.

## 2014-04-27 ENCOUNTER — Telehealth: Payer: Self-pay

## 2014-04-27 ENCOUNTER — Other Ambulatory Visit: Payer: Self-pay | Admitting: Internal Medicine

## 2014-04-27 DIAGNOSIS — R1084 Generalized abdominal pain: Secondary | ICD-10-CM

## 2014-04-27 DIAGNOSIS — R140 Abdominal distension (gaseous): Secondary | ICD-10-CM

## 2014-04-27 NOTE — Telephone Encounter (Signed)
Pt left v/m; pt was seen recently and pt calling back with update; stomach problem is no better;same as when seen and pt wants to know if needs f/u appt with Webb Silversmith NP or GI doctor. Pt request cb.

## 2014-04-27 NOTE — Telephone Encounter (Signed)
GI referral placed

## 2014-05-21 ENCOUNTER — Other Ambulatory Visit: Payer: Self-pay | Admitting: Internal Medicine

## 2014-07-10 ENCOUNTER — Other Ambulatory Visit: Payer: Self-pay

## 2014-07-10 DIAGNOSIS — I10 Essential (primary) hypertension: Secondary | ICD-10-CM

## 2014-07-10 MED ORDER — ATENOLOL 50 MG PO TABS: ORAL_TABLET | ORAL | Status: AC

## 2014-07-21 ENCOUNTER — Telehealth: Payer: Self-pay | Admitting: Internal Medicine

## 2014-07-21 NOTE — Telephone Encounter (Signed)
Okay with me if Okay with Dr. Deborra Medina

## 2014-07-21 NOTE — Telephone Encounter (Signed)
Pt called wanting to switch from [facility] to dr Deborra Medina.   Pt stated she didn't feel comfortable with [facility]

## 2014-07-21 NOTE — Telephone Encounter (Signed)
Unfortunately, I am not accepting any transfers at this point because I am part time and limiting my new patients.

## 2014-07-21 NOTE — Telephone Encounter (Signed)
Left message asking pt to call the office   See below message

## 2014-07-21 NOTE — Telephone Encounter (Signed)
Spoke with pt pt aware dr Deborra Medina is not accepting any transfers

## 2015-01-12 ENCOUNTER — Encounter
Admission: RE | Disposition: A | Discharge status: Home / Self Care | Payer: Self-pay | Source: Ambulatory Visit | Attending: Gastroenterology

## 2015-01-12 ENCOUNTER — Encounter: Payer: Self-pay | Admitting: *Deleted

## 2015-01-12 ENCOUNTER — Ambulatory Visit
Admission: RE | Admit: 2015-01-12 | Discharge: 2015-01-12 | Disposition: A | Discharge status: Home / Self Care | Payer: 59 | Source: Ambulatory Visit | Attending: Gastroenterology | Admitting: Gastroenterology

## 2015-01-12 ENCOUNTER — Ambulatory Visit: Payer: 59 | Admitting: Anesthesiology

## 2015-01-12 DIAGNOSIS — K219 Gastro-esophageal reflux disease without esophagitis: Secondary | ICD-10-CM | POA: Insufficient documentation

## 2015-01-12 DIAGNOSIS — I739 Peripheral vascular disease, unspecified: Secondary | ICD-10-CM | POA: Insufficient documentation

## 2015-01-12 DIAGNOSIS — E785 Hyperlipidemia, unspecified: Secondary | ICD-10-CM | POA: Diagnosis not present

## 2015-01-12 DIAGNOSIS — Z7982 Long term (current) use of aspirin: Secondary | ICD-10-CM | POA: Diagnosis not present

## 2015-01-12 DIAGNOSIS — K5730 Diverticulosis of large intestine without perforation or abscess without bleeding: Secondary | ICD-10-CM | POA: Insufficient documentation

## 2015-01-12 DIAGNOSIS — I10 Essential (primary) hypertension: Secondary | ICD-10-CM | POA: Insufficient documentation

## 2015-01-12 DIAGNOSIS — Z79899 Other long term (current) drug therapy: Secondary | ICD-10-CM | POA: Insufficient documentation

## 2015-01-12 DIAGNOSIS — D125 Benign neoplasm of sigmoid colon: Secondary | ICD-10-CM | POA: Diagnosis not present

## 2015-01-12 DIAGNOSIS — Z1211 Encounter for screening for malignant neoplasm of colon: Secondary | ICD-10-CM | POA: Diagnosis present

## 2015-01-12 HISTORY — PX: COLONOSCOPY WITH PROPOFOL: SHX5780

## 2015-01-12 SURGERY — COLONOSCOPY WITH PROPOFOL
Anesthesia: General

## 2015-01-12 MED ORDER — LIDOCAINE HCL (PF) 1 % IJ SOLN
INTRAMUSCULAR | Status: AC
  Administered 2015-01-12: 0.3 mL via INTRADERMAL
  Filled 2015-01-12: qty 2

## 2015-01-12 MED ORDER — MIDAZOLAM HCL 2 MG/2ML IJ SOLN
INTRAMUSCULAR | Status: DC | PRN
  Administered 2015-01-12: 1 mg via INTRAVENOUS

## 2015-01-12 MED ORDER — LIDOCAINE HCL (PF) 1 % IJ SOLN
2.0000 mL | Freq: Once | INTRAMUSCULAR | Status: AC
  Administered 2015-01-12: 0.3 mL via INTRADERMAL

## 2015-01-12 MED ORDER — SODIUM CHLORIDE 0.9 % IV SOLN
INTRAVENOUS | Status: DC
  Administered 2015-01-12: 11:00:00 via INTRAVENOUS

## 2015-01-12 MED ORDER — SODIUM CHLORIDE 0.9 % IV SOLN
INTRAVENOUS | Status: DC
  Administered 2015-01-12: 1000 mL via INTRAVENOUS

## 2015-01-12 MED ORDER — PROPOFOL 10 MG/ML IV BOLUS
INTRAVENOUS | Status: DC | PRN
  Administered 2015-01-12: 30 mg via INTRAVENOUS
  Administered 2015-01-12 (×3): 20 mg via INTRAVENOUS
  Administered 2015-01-12 (×2): 30 mg via INTRAVENOUS
  Administered 2015-01-12: 20 mg via INTRAVENOUS

## 2015-01-12 NOTE — H&P (Signed)
Outpatient short stay form Pre-procedure 01/12/2015 10:49 AM Lollie Sails MD  Primary Physician: Dr. Fulton Reek  Reason for visit:  Colonoscopy  History of present illness:  Patient is a 64 year old female presenting for a screening colonoscopy. This is her first colonoscopy. He does not take any anticoagulation mediciness or blood thinners-aspirin products with the exception of a daily 81 mg aspirin. sHe last took that yesterday.    Current facility-administered medications:  .  0.9 %  sodium chloride infusion, , Intravenous, Continuous, Lollie Sails, MD .  0.9 %  sodium chloride infusion, , Intravenous, Continuous, Lollie Sails, MD .  lidocaine (PF) (XYLOCAINE) 1 % injection 2 mL, 2 mL, Intradermal, Once, Lollie Sails, MD .  lidocaine (PF) (XYLOCAINE) 1 % injection, , , ,   Prescriptions prior to admission  Medication Sig Dispense Refill Last Dose  . aspirin 81 MG tablet Take 81 mg by mouth daily.   Taking  . atenolol (TENORMIN) 50 MG tablet TAKE ONE TABLET BY MOUTH EVERY DAY. *PATIENT MUST MAKE AN APPOINTMENT FOR ANY FURTHER REFILLS [phone]* 90 tablet 1 01/12/2015 at 0430  . pantoprazole (PROTONIX) 40 MG tablet Take 1 tablet (40 mg total) by mouth daily. 30 tablet 0   . simvastatin (ZOCOR) 20 MG tablet TAKE ONE TABLET BY MOUTH ONCE DAILY 90 tablet 0      No Known Allergies   Past Medical History  Diagnosis Date  . Hyperlipidemia   . Hypertension   . Cancer     breast  . Tobacco abuse   . PVD (peripheral vascular disease)     right carotid bruit  . GERD (gastroesophageal reflux disease)   . Colitis     collagenous    Review of systems:      Physical Exam    Heart and lungs: Regular rate and rhythm without rub murmur gallop, lungs are bilaterally clear    HEENT: Atraumatic eyes are anicteric    Other:     Pertinant exam for procedure: Soft nontender nondistended bowel sounds positive normoactive    Planned proceedures: Colonoscopy  and indicated procedures I have discussed the risks benefits and complications of procedures to include not limited to bleeding, infection, perforation and the risk of sedation and the patient wishes to proceed.    Lollie Sails, MD Gastroenterology 01/12/2015  10:49 AM

## 2015-01-12 NOTE — Transfer of Care (Signed)
Immediate Anesthesia Transfer of Care Note  Patient: Natalie Benson  Procedure(s) Performed: Procedure(s): COLONOSCOPY WITH PROPOFOL (N/A)  Patient Location: PACU and Endoscopy Unit  Anesthesia Type:General  Level of Consciousness: sedated  Airway & Oxygen Therapy: Patient Spontanous Breathing  Post-op Assessment: Report given to RN  Post vital signs: stable  Last Vitals:  Filed Vitals:   01/12/15 1126  BP: 117/54  Pulse: 65  Temp: 36.2 C  Resp: 16    Complications: No apparent anesthesia complications

## 2015-01-12 NOTE — Op Note (Signed)
[facility] Gastroenterology Patient Name: Natalie Benson Procedure Date: 01/12/2015 10:46 AM MRN: [id number] Account #: [id number] Date of Birth: 09/02/1950 Admit Type: Outpatient Age: 64 Room: [facility] ENDO ROOM 2 Gender: Female Note Status: Finalized Procedure:         Colonoscopy Indications:       Screening for colorectal malignant neoplasm, This is the                     patient's first colonoscopy Providers:         Lollie Sails, MD Referring MD:      Leonie Douglas. Doy Hutching, MD (Referring MD) Medicines:         Monitored Anesthesia Care Complications:     No immediate complications. Procedure:         Pre-Anesthesia Assessment:                    - ASA Grade Assessment: II - A patient with mild systemic                     disease.                    After obtaining informed consent, the colonoscope was                     passed under direct vision. Throughout the procedure, the                     patient's blood pressure, pulse, and oxygen saturations                     were monitored continuously. The Olympus PCF-H180AL                     colonoscope ( S#: [id number] ) was introduced through the                     anus and advanced to the the cecum, identified by                     appendiceal orifice and ileocecal valve. The colonoscopy                     was performed without difficulty. The patient tolerated                     the procedure well. The quality of the bowel preparation                     was good. Findings:      A less than 1 mm polyp was found in the sigmoid colon. The polyp was       sessile. The polyp was removed with a cold biopsy forceps. Resection and       retrieval were complete.      Two sessile polyps were found in the distal sigmoid colon. The polyps       were 1 to 2 mm in size. These polyps were removed with a cold biopsy       forceps. Resection and retrieval were complete.      A few small-mouthed diverticula were found  in the sigmoid colon and in       the distal descending colon.      The retroflexed view of the  distal rectum and anal verge was normal and       showed no anal or rectal abnormalities. Impression:        - One less than 1 mm polyp in the sigmoid colon. Resected                     and retrieved.                    - Two 1 to 2 mm polyps in the distal sigmoid colon.                     Resected and retrieved.                    - Diverticulosis in the sigmoid colon and in the distal                     descending colon.                    - The distal rectum and anal verge are normal on                     retroflexion view. Recommendation:    - Await pathology results.                    - Telephone GI clinic for pathology results in 1 week. Procedure Code(s): --- Professional ---                    [phone], Colonoscopy, flexible; with biopsy, single or                     multiple Diagnosis Code(s): --- Professional ---                    V76.51, Special screening for malignant neoplasms of colon                    211.3, Benign neoplasm of colon                    562.10, Diverticulosis of colon (without mention of                     hemorrhage) CPT copyright 2014 American Medical Association. All rights reserved. The codes documented in this report are preliminary and upon coder review may  be revised to meet current compliance requirements. Lollie Sails, MD 01/12/2015 11:26:02 AM This report has been signed electronically. Number of Addenda: 0 Note Initiated On: 01/12/2015 10:46 AM Scope Withdrawal Time: 0 hours 10 minutes 16 seconds  Total Procedure Duration: 0 hours 15 minutes 3 seconds       [facility]

## 2015-01-12 NOTE — Anesthesia Postprocedure Evaluation (Signed)
  Anesthesia Post-op Note  Patient: Natalie Benson  Procedure(s) Performed: Procedure(s): COLONOSCOPY WITH PROPOFOL (N/A)  Anesthesia type:General  Patient location: PACU  Post pain: Pain level controlled  Post assessment: Post-op Vital signs reviewed, Patient's Cardiovascular Status Stable, Respiratory Function Stable, Patent Airway and No signs of Nausea or vomiting  Post vital signs: Reviewed and stable  Last Vitals:  Filed Vitals:   01/12/15 1126  BP: 117/54  Pulse: 65  Temp: 36.2 C  Resp: 16    Level of consciousness: awake, alert  and patient cooperative  Complications: No apparent anesthesia complications

## 2015-01-12 NOTE — Anesthesia Preprocedure Evaluation (Signed)
Anesthesia Evaluation  Patient identified by MRN, date of birth, ID band Patient awake    Reviewed: Allergy & Precautions, H&P , NPO status , Patient's Chart, lab work & pertinent test results, reviewed documented beta blocker date and time   Airway Mallampati: II  TM Distance: >3 FB Neck ROM: full    Dental no notable dental hx. (+) Teeth Intact   Pulmonary neg pulmonary ROS, Current Smoker,  breath sounds clear to auscultation  Pulmonary exam normal       Cardiovascular Exercise Tolerance: Good hypertension, negative cardio ROS  Rhythm:regular Rate:Normal     Neuro/Psych PSYCHIATRIC DISORDERS negative neurological ROS  negative psych ROS   GI/Hepatic negative GI ROS, Neg liver ROS, GERD-  ,  Endo/Other  negative endocrine ROS  Renal/GU negative Renal ROS  negative genitourinary   Musculoskeletal   Abdominal   Peds  Hematology negative hematology ROS (+)   Anesthesia Other Findings   Reproductive/Obstetrics negative OB ROS                             Anesthesia Physical Anesthesia Plan  ASA: II  Anesthesia Plan: General   Post-op Pain Management:    Induction:   Airway Management Planned:   Additional Equipment:   Intra-op Plan:   Post-operative Plan:   Informed Consent: I have reviewed the patients History and Physical, chart, labs and discussed the procedure including the risks, benefits and alternatives for the proposed anesthesia with the patient or authorized representative who has indicated his/her understanding and acceptance.   Dental Advisory Given  Plan Discussed with: CRNA  Anesthesia Plan Comments:         Anesthesia Quick Evaluation

## 2015-01-15 ENCOUNTER — Encounter: Payer: Self-pay | Admitting: Gastroenterology

## 2015-01-15 LAB — SURGICAL PATHOLOGY

## 2015-01-26 ENCOUNTER — Encounter: Payer: Self-pay | Admitting: *Deleted

## 2016-05-23 ENCOUNTER — Emergency Department
Admission: EM | Admit: 2016-05-23 | Discharge: 2016-05-23 | Disposition: A | Discharge status: Home / Self Care | Payer: PPO | Attending: Emergency Medicine | Admitting: Emergency Medicine

## 2016-05-23 ENCOUNTER — Encounter: Payer: Self-pay | Admitting: Emergency Medicine

## 2016-05-23 ENCOUNTER — Emergency Department: Payer: PPO

## 2016-05-23 DIAGNOSIS — Z853 Personal history of malignant neoplasm of breast: Secondary | ICD-10-CM | POA: Insufficient documentation

## 2016-05-23 DIAGNOSIS — F17210 Nicotine dependence, cigarettes, uncomplicated: Secondary | ICD-10-CM | POA: Insufficient documentation

## 2016-05-23 DIAGNOSIS — R112 Nausea with vomiting, unspecified: Secondary | ICD-10-CM | POA: Insufficient documentation

## 2016-05-23 DIAGNOSIS — I10 Essential (primary) hypertension: Secondary | ICD-10-CM | POA: Diagnosis not present

## 2016-05-23 DIAGNOSIS — R1013 Epigastric pain: Secondary | ICD-10-CM | POA: Insufficient documentation

## 2016-05-23 DIAGNOSIS — Z7982 Long term (current) use of aspirin: Secondary | ICD-10-CM | POA: Diagnosis not present

## 2016-05-23 DIAGNOSIS — Z79899 Other long term (current) drug therapy: Secondary | ICD-10-CM | POA: Diagnosis not present

## 2016-05-23 LAB — URINALYSIS COMPLETE WITH MICROSCOPIC (ARMC ONLY)
BACTERIA UA: NONE SEEN
Bilirubin Urine: NEGATIVE
Glucose, UA: NEGATIVE mg/dL
Ketones, ur: NEGATIVE mg/dL
Leukocytes, UA: NEGATIVE
NITRITE: NEGATIVE
PROTEIN: NEGATIVE mg/dL
Specific Gravity, Urine: 1.017 (ref 1.005–1.030)
pH: 6 (ref 5.0–8.0)

## 2016-05-23 LAB — COMPREHENSIVE METABOLIC PANEL
ALBUMIN: 4.1 g/dL (ref 3.5–5.0)
ALT: 16 U/L (ref 14–54)
ANION GAP: 8 (ref 5–15)
AST: 19 U/L (ref 15–41)
Alkaline Phosphatase: 63 U/L (ref 38–126)
BUN: 18 mg/dL (ref 6–20)
CO2: 28 mmol/L (ref 22–32)
Calcium: 9.1 mg/dL (ref 8.9–10.3)
Chloride: 102 mmol/L (ref 101–111)
Creatinine, Ser: 0.89 mg/dL (ref 0.44–1.00)
GFR calc Af Amer: >60 mL/min (ref >60)
GFR calc non Af Amer: >60 mL/min (ref >60)
Glucose, Bld: 127 mg/dL — ABNORMAL HIGH (ref 65–99)
Potassium: 4.1 mmol/L (ref 3.5–5.1)
SODIUM: 138 mmol/L (ref 135–145)
Total Bilirubin: 0.4 mg/dL (ref 0.3–1.2)
Total Protein: 7.1 g/dL (ref 6.5–8.1)

## 2016-05-23 LAB — CBC
HCT: 44 % (ref 35.0–47.0)
HEMOGLOBIN: 14.8 g/dL (ref 12.0–16.0)
MCH: 31.1 pg (ref 26.0–34.0)
MCHC: 33.7 g/dL (ref 32.0–36.0)
MCV: 92.4 fL (ref 80.0–100.0)
Platelets: 291 10*3/uL (ref 150–440)
RBC: 4.76 MIL/uL (ref 3.80–5.20)
RDW: 13.6 % (ref 11.5–14.5)
WBC: 19.2 10*3/uL — ABNORMAL HIGH (ref 3.6–11.0)

## 2016-05-23 LAB — LIPASE, BLOOD: Lipase: 34 U/L (ref 11–51)

## 2016-05-23 LAB — TROPONIN I

## 2016-05-23 MED ORDER — SODIUM CHLORIDE 0.9 % IV BOLUS (SEPSIS)
1000.0000 mL | Freq: Once | INTRAVENOUS | Status: AC
  Administered 2016-05-23: 1000 mL via INTRAVENOUS

## 2016-05-23 MED ORDER — FAMOTIDINE IN NACL 20-0.9 MG/50ML-% IV SOLN
20.0000 mg | Freq: Once | INTRAVENOUS | Status: AC
  Administered 2016-05-23: 20 mg via INTRAVENOUS
  Filled 2016-05-23: qty 50

## 2016-05-23 MED ORDER — DICYCLOMINE HCL 20 MG PO TABS
20.0000 mg | ORAL_TABLET | Freq: Three times a day (TID) | ORAL | 0 refills | Status: AC | PRN
Start: 2016-05-23 — End: 2017-05-23

## 2016-05-23 MED ORDER — ONDANSETRON HCL 4 MG PO TABS: 4.0000 mg | ORAL_TABLET | Freq: Every day | ORAL | 0 refills | Status: AC | PRN

## 2016-05-23 MED ORDER — ONDANSETRON HCL 4 MG/2ML IJ SOLN
4.0000 mg | Freq: Once | INTRAMUSCULAR | Status: AC
  Administered 2016-05-23: 4 mg via INTRAVENOUS
  Filled 2016-05-23: qty 2

## 2016-05-23 MED ORDER — MORPHINE SULFATE (PF) 4 MG/ML IV SOLN
2.0000 mg | Freq: Once | INTRAVENOUS | Status: AC
  Administered 2016-05-23: 2 mg via INTRAVENOUS
  Filled 2016-05-23: qty 1

## 2016-05-23 MED ORDER — GI COCKTAIL ~~LOC~~
30.0000 mL | Freq: Once | ORAL | Status: AC
  Administered 2016-05-23: 30 mL via ORAL
  Filled 2016-05-23: qty 30

## 2016-05-23 MED ORDER — IOPAMIDOL (ISOVUE-300) INJECTION 61%
100.0000 mL | Freq: Once | INTRAVENOUS | Status: AC | PRN
  Administered 2016-05-23: 100 mL via INTRAVENOUS

## 2016-05-23 MED ORDER — IOPAMIDOL (ISOVUE-300) INJECTION 61%
30.0000 mL | Freq: Once | INTRAVENOUS | Status: AC | PRN
  Administered 2016-05-23: 30 mL via ORAL

## 2016-05-23 NOTE — ED Notes (Signed)
PT c/o of epigastric pain beginning approx 1 hour after eating today described as burning. Pt reports hx of breast cancer with bilateral mastectomy, GERD, appendectomy and cholecystectomy

## 2016-05-23 NOTE — ED Provider Notes (Signed)
[facility] Emergency Department Provider Note   ____________________________________________   First MD Initiated Contact with Patient 05/23/16 [phone]     (approximate)  I have reviewed the triage vital signs and the nursing notes.   HISTORY  Chief Complaint Abdominal Pain    HPI Natalie Benson is a 65 y.o. female who presents to the ED from home with chief complaint of abdominal pain and vomiting. Reports onset of epigastric abdominal pain and burning approximately one hour after eating the chips and mashed potatoes last evening. States she has not been to bed all night secondary to multiple episodes of emesis. Denies diarrhea but states she had a loose stool. Denies associated fever, chills, chest pain, shortness of breath, dysuria. Denies recent travel or trauma. Nothing makes her symptoms better or worse.   Past Medical History:  Diagnosis Date  . Cancer (HCC)    breast  . Colitis    collagenous  . GERD (gastroesophageal reflux disease)   . Hyperlipidemia   . Hypertension   . PVD (peripheral vascular disease) ([place])    right carotid bruit  . Tobacco abuse     Patient Active Problem List   Diagnosis Date Noted  . HLD (hyperlipidemia) 03/31/2014  . RLQ abdominal pain 12/20/2013  . BREAST CANCER, HX OF 11/12/2006  . TOBACCO ABUSE 10/26/2006  . HYPERTENSION 10/26/2006  . PVD 10/26/2006    Past Surgical History:  Procedure Laterality Date  . ABDOMINAL HYSTERECTOMY     vaginal  . APPENDECTOMY    . CHOLECYSTECTOMY    . COLONOSCOPY WITH PROPOFOL N/A 01/12/2015   Procedure: COLONOSCOPY WITH PROPOFOL;  Surgeon: Lollie Sails, MD;  Location: [facility] ENDOSCOPY;  Service: Endoscopy;  Laterality: N/A;  . MASTECTOMY    . NOSE SURGERY      Prior to Admission medications   Medication Sig Start Date End Date Taking? Authorizing Provider  aspirin 81 MG tablet Take 81 mg by mouth daily.   Yes Historical Provider, MD  meloxicam (MOBIC) 7.5 MG tablet  Take 7.5 mg by mouth daily.   Yes Historical Provider, MD  metoprolol succinate (TOPROL-XL) 100 MG 24 hr tablet Take 100 mg by mouth daily.   Yes Historical Provider, MD  omeprazole (PRILOSEC) 20 MG capsule Take 20 mg by mouth daily.   Yes Historical Provider, MD  simvastatin (ZOCOR) 20 MG tablet TAKE ONE TABLET BY MOUTH ONCE DAILY 05/22/14  Yes Jearld Fenton, NP  tiZANidine (ZANAFLEX) 4 MG tablet Take 4 mg by mouth at bedtime.   Yes Historical Provider, MD  atenolol (TENORMIN) 50 MG tablet TAKE ONE TABLET BY MOUTH EVERY DAY. *PATIENT MUST MAKE AN APPOINTMENT FOR ANY FURTHER REFILLS [phone] Patient not taking: Reported on 05/23/2016 07/10/14   Jearld Fenton, NP  pantoprazole (PROTONIX) 40 MG tablet Take 1 tablet (40 mg total) by mouth daily. Patient not taking: Reported on 05/23/2016 03/31/14   Jearld Fenton, NP    Allergies Patient has no known allergies.  Family History  Problem Relation Age of Onset  . Congestive Heart Failure Father   . Cancer Mother     Gi Cancer  . Cancer Sister     breast  . COPD Brother   . Hypertension Brother     Social History Social History  Substance Use Topics  . Smoking status: Current Every Day Smoker    Packs/day: 1.50    Types: Cigarettes  . Smokeless tobacco: Never Used  . Alcohol use Yes  Comment: occasional--wine    Review of Systems  Constitutional: No fever/chills. Eyes: No visual changes. ENT: No sore throat. Cardiovascular: Denies chest pain. Respiratory: Denies shortness of breath. Gastrointestinal: Positive for abdominal pain.  Positive for nausea and vomiting.  No diarrhea.  No constipation. Genitourinary: Negative for dysuria. Musculoskeletal: Negative for back pain. Skin: Negative for rash. Neurological: Negative for headaches, focal weakness or numbness.  10-point ROS otherwise negative.  ____________________________________________   PHYSICAL EXAM:  VITAL SIGNS: ED Triage Vitals  Enc Vitals Group      BP 05/23/16 0125 (!) 190/92     Pulse Rate 05/23/16 0123 65     Resp 05/23/16 0123 18     Temp 05/23/16 0123 97.7 F (36.5 C)     Temp Source 05/23/16 0123 Oral     SpO2 05/23/16 0123 96 %     Weight 05/23/16 0125 175 lb (79.4 kg)     Height 05/23/16 0125 5\' 5"  (1.651 m)     Head Circumference --      Peak Flow --      Pain Score 05/23/16 0125 10     Pain Loc --      Pain Edu? --      Excl. in [place]? --     Constitutional: Alert and oriented. Well appearing and in mild acute distress. Eyes: Conjunctivae are normal. PERRL. EOMI. Head: Atraumatic. Nose: No congestion/rhinnorhea. Mouth/Throat: Mucous membranes are moist.  Oropharynx non-erythematous. Neck: No stridor.   Cardiovascular: Normal rate, regular rhythm. Grossly normal heart sounds.  Good peripheral circulation. Respiratory: Normal respiratory effort.  No retractions. Lungs CTAB. Gastrointestinal: Soft and mildly tender to palpation epigastrium without rebound or guarding. No distention. No abdominal bruits. No CVA tenderness. Musculoskeletal: No lower extremity tenderness nor edema.  No joint effusions. Neurologic:  Normal speech and language. No gross focal neurologic deficits are appreciated.  Skin:  Skin is warm, dry and intact. No rash noted. Psychiatric: Mood and affect are normal. Speech and behavior are normal.  ____________________________________________   LABS (all labs ordered are listed, but only abnormal results are displayed)  Labs Reviewed  CBC - Abnormal; Notable for the following:       Result Value   WBC 19.2 (*)    All other components within normal limits  COMPREHENSIVE METABOLIC PANEL - Abnormal; Notable for the following:    Glucose, Bld 127 (*)    All other components within normal limits  URINALYSIS COMPLETEWITH MICROSCOPIC ([facility] ONLY) - Abnormal; Notable for the following:    Color, Urine YELLOW (*)    APPearance CLEAR (*)    Hgb urine dipstick 2+ (*)    Squamous Epithelial / LPF 0-5 (*)     All other components within normal limits  LIPASE, BLOOD  TROPONIN I   ____________________________________________  EKG  ED ECG REPORT I, SUNG,JADE J, the attending physician, personally viewed and interpreted this ECG.   Date: 05/23/2016  EKG Time: 0334  Rate: 60  Rhythm: normal EKG, normal sinus rhythm  Axis: Normal  Intervals:none  ST&T Change: Nonspecific  ____________________________________________  RADIOLOGY  Pending ____________________________________________   PROCEDURES  Procedure(s) performed: None  Procedures  Critical Care performed: No  ____________________________________________   INITIAL IMPRESSION / ASSESSMENT AND PLAN / ED COURSE  Pertinent labs & imaging results that were available during my care of the patient were reviewed by me and considered in my medical decision making (see chart for details).  65 year old female who presents with epigastric pain associated with multiple episodes  of nausea/vomiting. History of cholecystectomy as well as appendectomy. Laboratory and urinalysis results remarkable for leukocytosis. Will initiate IV fluid resuscitation, analgesia/antiemetic and proceed with CT abdomen/pelvis to evaluate for intra-abdominal etiology.  Clinical Course as of May 23 704  Fri May 23, 2016  0600 Patient working on second bottle of oral contrast. Complains of pain and nausea; will repeat morphine and Zofran.  [JS]  0701 Care transferred to Dr. Clearnce Hasten pending results of CT scan.  [JS]    Clinical Course User Index [JS] Paulette Blanch, MD     ____________________________________________   FINAL CLINICAL IMPRESSION(S) / ED DIAGNOSES  Final diagnoses:  Epigastric pain      NEW MEDICATIONS STARTED DURING THIS VISIT:  New Prescriptions   No medications on file     Note:  This document was prepared using Dragon voice recognition software and may include unintentional dictation errors.    Paulette Blanch,  MD 05/23/16 [phone]

## 2016-05-23 NOTE — ED Triage Notes (Signed)
Pt in with co upper abd pain for few days, has hx of reflux but states symptoms are worse. Has had some vomiting and loose stools.

## 2016-05-23 NOTE — ED Notes (Signed)
Ct tech notified pt is finished drinking contrast.

## 2016-05-23 NOTE — ED Notes (Signed)
E-signature pad not working, pt verbalized understanding of discharge instructions.  

## 2016-05-23 NOTE — ED Provider Notes (Signed)
Signout from Dr. Beather Arbour this 65 year old female with a history of an appendectomy as well as cholecystectomy that became ill after eating at [facility]. She said that her symptoms started one hour after leaving the restaurant. She is still complaining of epigastric pain despite 2 rounds of morphine as well as Zofran. She has also vomited her contrast after 2 rounds of morphine and Zofran.  Plan from Dr. Beather Arbour is to follow the CAT scan and disposition accordingly.  Physical Exam  BP (!) 184/82   Pulse 63   Temp 97.7 F (36.5 C) (Oral)   Resp 16   Ht 5\' 5"  (1.651 m)   Wt 175 lb (79.4 kg)   SpO2 94%   BMI 29.12 kg/m   Physical Exam Moderate epigastric tenderness to palpation on exam. ED Course  Procedures CT Abdomen Pelvis W Contrast (Final result)  Result time 05/23/16 07:47:05  Final result by Sherryl Barters, MD (05/23/16 07:47:05)           Narrative:   CLINICAL DATA: Epigastric pain beginning 1 hour after eating today.  EXAM: CT ABDOMEN AND PELVIS WITH CONTRAST  TECHNIQUE: Multidetector CT imaging of the abdomen and pelvis was performed using the standard protocol following bolus administration of intravenous contrast.  CONTRAST: 171mL ISOVUE-300 IOPAMIDOL (ISOVUE-300) INJECTION 61%  COMPARISON: 12/22/2013  FINDINGS: Lower chest: Stable calcified granulomas at the right lung base. Centrilobular emphysema. Dependent atelectasis in both lower lobes and mild atelectasis in the lingula along the diaphragm. Mild cardiomegaly with left anterior descending coronary artery atherosclerotic calcification.  Hepatobiliary: Scattered punctate calcifications compatible with old granulomatous disease. Intrahepatic biliary dilatation is mild-to-moderate but increased from prior. Common bile duct 1.7 cm, formerly 1.2 cm. Cholecystectomy. CBD dilatation extends all the way to the ampulla.  Pancreas: Unremarkable  Spleen: Punctate calcifications compatible with  old granulomatous disease.  Adrenals/Urinary Tract: Unremarkable  Stomach/Bowel: Sigmoid colon diverticulosis. Fat density in the proximal colon wall to the level of the sigmoid colon. No dilated small bowel. Appendix surgically absent.  Vascular/Lymphatic: Aortoiliac atherosclerotic vascular disease. No pathologic adenopathy identified.  Reproductive: Uterus absent. Adnexa unremarkable.  Other: Unremarkable  Musculoskeletal: Dextroconvex lumbar scoliosis centered at L2-3 with lumbar spondylosis and degenerative disc disease contributing to various degrees of impingement at L2- 3, L3-4, L4-5, and L5-S1.  IMPRESSION: 1. Increase in biliary dilatation, CBD now 1.7 cm and previously 1.2 cm, and increase in intrahepatic biliary dilatation, but a specific cause for this increase is not identified. I do not see an obvious pancreatic or ampullary mass. Correlate with bilirubin levels in determining need for further workup. 2. Fat density in the colonic wall can be a normal variant but does have a weak association with inflammatory bowel disease. 3. Sigmoid colon diverticulosis. 4. Old granulomatous disease. 5. Centrilobular emphysema. 6. Mild cardiomegaly with coronary atherosclerosis. 7. Mild bibasilar atelectasis. 8. Lumbar scoliosis, spondylosis, and degenerative disc disease causing multilevel impingement. 9. Aortoiliac atherosclerotic vascular disease.   Electronically Signed By: Van Clines M.D. On: 05/23/2016 07:47            MDM Patient with history of reflux. We'll try GI cocktail.  ----------------------------------------- 8:39 AM on 05/23/2016 -----------------------------------------  Patient tolerated GI cocktail and still says she is having pain but says she is no longer nauseous.  I offered the patient admission to the hospital for her continued symptoms which she says she would rather try symptomatic treatment at home. I'll be discharging  her with Zofran and Bentyl. We also discussed the CT results  and follow-up with her primary care doctor, Dr. Doy Hutching. I made sure to reemphasize multiple times that she is welcome back at any point if her symptoms continue or worsen. She is understanding and willing to comply with this plan. She is on omeprazole at home.      Orbie Pyo, MD 05/23/16 [phone]

## 2016-05-23 NOTE — ED Notes (Signed)
This RN woke up patient and encouraged pt to drink contrast. Pt began sipping on drink. Pt falling in and out of sleep. CT tech and MD notified.

## 2016-05-27 ENCOUNTER — Encounter: Payer: Self-pay | Admitting: Gastroenterology

## 2016-05-27 ENCOUNTER — Ambulatory Visit (INDEPENDENT_AMBULATORY_CARE_PROVIDER_SITE_OTHER): Payer: PPO | Admitting: Gastroenterology

## 2016-05-27 VITALS — BP 157/78 | HR 73 | Temp 98.4°F | Ht 65.0 in | Wt 173.0 lb

## 2016-05-27 DIAGNOSIS — R1013 Epigastric pain: Secondary | ICD-10-CM | POA: Diagnosis not present

## 2016-05-27 DIAGNOSIS — R112 Nausea with vomiting, unspecified: Secondary | ICD-10-CM | POA: Diagnosis not present

## 2016-05-27 DIAGNOSIS — K838 Other specified diseases of biliary tract: Secondary | ICD-10-CM | POA: Diagnosis not present

## 2016-05-27 NOTE — Progress Notes (Signed)
Gastroenterology Consultation  Referring Provider:     Jearld Fenton, NP Primary Care Physician:  Webb Silversmith, NP Primary Gastroenterologist:  Dr. Allen Norris     Reason for Consultation:     Dilated common bile duct        HPI:   Natalie Benson is a 65 y.o. y/o female referred for consultation & management of Dilated common bile duct by Dr. Webb Silversmith, NP.  As patient comes in today with the finding of a dilated common bile duct of 12 mm back in 2015. The patient then came in recently to see her primary care provider because of feeling sick with abdominal pain in the right upper quadrant and epigastric area. The patient states this started after she had dinner at a steak house. The patient then reports that she had nausea vomiting and abdominal pain. Since then the patient has felt better but continues to have pain after she eats. There is no report of any nausea or residual vomiting. The patient also states that she has a burning in her stomach when she has eaten. The patient does not have a gallbladder but on repeat CT scan done on December 1 of this year the patient had her bile duct going up from 12 mm to 17 mm. The patient had normal liver enzymes but did have an elevated white cell count. The patient was seen by her primary care provider this morning and prescribed ciprofloxacin and Zantac and Carafate.  Past Medical History:  Diagnosis Date  . Cancer (HCC)    breast  . Colitis    collagenous  . GERD (gastroesophageal reflux disease)   . Hyperlipidemia   . Hypertension   . PVD (peripheral vascular disease) ([place])    right carotid bruit  . Tobacco abuse     Past Surgical History:  Procedure Laterality Date  . ABDOMINAL HYSTERECTOMY     vaginal  . APPENDECTOMY    . CHOLECYSTECTOMY    . COLONOSCOPY WITH PROPOFOL N/A 01/12/2015   Procedure: COLONOSCOPY WITH PROPOFOL;  Surgeon: Lollie Sails, MD;  Location: [facility] ENDOSCOPY;  Service: Endoscopy;  Laterality: N/A;  . MASTECTOMY     . NOSE SURGERY      Prior to Admission medications   Medication Sig Start Date End Date Taking? Authorizing Provider  aspirin 81 MG tablet Take 81 mg by mouth daily.   Yes Historical Provider, MD  metoprolol succinate (TOPROL-XL) 100 MG 24 hr tablet Take 100 mg by mouth daily.   Yes Historical Provider, MD  omeprazole (PRILOSEC) 20 MG capsule Take 20 mg by mouth daily.   Yes Historical Provider, MD  simvastatin (ZOCOR) 20 MG tablet TAKE ONE TABLET BY MOUTH ONCE DAILY 05/22/14  Yes Jearld Fenton, NP  atenolol (TENORMIN) 50 MG tablet TAKE ONE TABLET BY MOUTH EVERY DAY. *PATIENT MUST MAKE AN APPOINTMENT FOR ANY FURTHER REFILLS [phone]* Patient not taking: Reported on 05/27/2016 07/10/14   Jearld Fenton, NP  dicyclomine (BENTYL) 20 MG tablet Take 1 tablet (20 mg total) by mouth 3 (three) times daily as needed for spasms. Patient not taking: Reported on 05/27/2016 05/23/16 05/23/17  Orbie Pyo, MD  meloxicam (MOBIC) 7.5 MG tablet Take 7.5 mg by mouth daily.    Historical Provider, MD  ondansetron (ZOFRAN) 4 MG tablet Take 1 tablet (4 mg total) by mouth daily as needed. Patient not taking: Reported on 05/27/2016 05/23/16   Orbie Pyo, MD  pantoprazole (PROTONIX) 40 MG tablet Take  1 tablet (40 mg total) by mouth daily. Patient not taking: Reported on 05/27/2016 03/31/14   Jearld Fenton, NP  tiZANidine (ZANAFLEX) 4 MG tablet Take 4 mg by mouth at bedtime.    Historical Provider, MD    Family History  Problem Relation Age of Onset  . Congestive Heart Failure Father   . Cancer Mother     Gi Cancer  . Cancer Sister     breast  . COPD Brother   . Hypertension Brother      Social History  Substance Use Topics  . Smoking status: Current Every Day Smoker    Packs/day: 1.50    Types: Cigarettes  . Smokeless tobacco: Never Used  . Alcohol use Yes     Comment: occasional--wine    Allergies as of 05/27/2016  . (No Known Allergies)    Review of Systems:    All  systems reviewed and negative except where noted in HPI.   Physical Exam:  BP (!) 157/78   Pulse 73   Temp 98.4 F (36.9 C) (Oral)   Ht 5\' 5"  (1.651 m)   Wt 173 lb (78.5 kg)   BMI 28.79 kg/m  No LMP recorded. Patient has had a hysterectomy. Psych:  Alert and cooperative. Normal mood and affect. General:   Alert,  Well-developed, well-nourished, pleasant and cooperative in NAD Head:  Normocephalic and atraumatic. Eyes:  Sclera clear, no icterus.   Conjunctiva pink. Ears:  Normal auditory acuity. Nose:  No deformity, discharge, or lesions. Mouth:  No deformity or lesions,oropharynx pink & moist. Neck:  Supple; no masses or thyromegaly. Lungs:  Respirations even and unlabored.  Clear throughout to auscultation.   No wheezes, crackles, or rhonchi. No acute distress. Heart:  Regular rate and rhythm; no murmurs, clicks, rubs, or gallops. Abdomen:  Normal bowel sounds.  No bruits.  Soft, non-tender and non-distended without masses, hepatosplenomegaly or hernias noted.  No guarding or rebound tenderness.  Negative Carnett sign.   Rectal:  Deferred.  Msk:  Symmetrical without gross deformities.  Good, equal movement & strength bilaterally. Pulses:  Normal pulses noted. Extremities:  No clubbing or edema.  No cyanosis. Neurologic:  Alert and oriented x3;  grossly normal neurologically. Skin:  Intact without significant lesions or rashes.  No jaundice. Lymph Nodes:  No significant cervical adenopathy. Psych:  Alert and cooperative. Normal mood and affect.  Imaging Studies: Ct Abdomen Pelvis W Contrast  Result Date: 05/23/2016 CLINICAL DATA:  Epigastric pain beginning 1 hour after eating today. EXAM: CT ABDOMEN AND PELVIS WITH CONTRAST TECHNIQUE: Multidetector CT imaging of the abdomen and pelvis was performed using the standard protocol following bolus administration of intravenous contrast. CONTRAST:  16mL ISOVUE-300 IOPAMIDOL (ISOVUE-300) INJECTION 61% COMPARISON:  12/22/2013 FINDINGS:  Lower chest: Stable calcified granulomas at the right lung base. Centrilobular emphysema. Dependent atelectasis in both lower lobes and mild atelectasis in the lingula along the diaphragm. Mild cardiomegaly with left anterior descending coronary artery atherosclerotic calcification. Hepatobiliary: Scattered punctate calcifications compatible with old granulomatous disease. Intrahepatic biliary dilatation is mild-to-moderate but increased from prior. Common bile duct 1.7 cm, formerly 1.2 cm. Cholecystectomy. CBD dilatation extends all the way to the ampulla. Pancreas: Unremarkable Spleen: Punctate calcifications compatible with old granulomatous disease. Adrenals/Urinary Tract: Unremarkable Stomach/Bowel: Sigmoid colon diverticulosis. Fat density in the proximal colon wall to the level of the sigmoid colon. No dilated small bowel. Appendix surgically absent. Vascular/Lymphatic: Aortoiliac atherosclerotic vascular disease. No pathologic adenopathy identified. Reproductive: Uterus absent.  Adnexa unremarkable. Other: Unremarkable  Musculoskeletal: Dextroconvex lumbar scoliosis centered at L2-3 with lumbar spondylosis and degenerative disc disease contributing to various degrees of impingement at L2- 3, L3-4, L4-5, and L5-S1. IMPRESSION: 1. Increase in biliary dilatation, CBD now 1.7 cm and previously 1.2 cm, and increase in intrahepatic biliary dilatation, but a specific cause for this increase is not identified. I do not see an obvious pancreatic or ampullary mass. Correlate with bilirubin levels in determining need for further workup. 2. Fat density in the colonic wall can be a normal variant but does have a weak association with inflammatory bowel disease. 3. Sigmoid colon diverticulosis. 4. Old granulomatous disease. 5. Centrilobular emphysema. 6. Mild cardiomegaly with coronary atherosclerosis. 7. Mild bibasilar atelectasis. 8. Lumbar scoliosis, spondylosis, and degenerative disc disease causing multilevel  impingement. 9.  Aortoiliac atherosclerotic vascular disease. Electronically Signed   By: Van Clines M.D.   On: 05/23/2016 07:47    Assessment and Plan:   ARKESHA RAYSOR is a 65 y.o. y/o female who comes in today with abdominal pain and dyspepsia after eating a steak house. The patient's elevated white cell count is being treated with antibiotic by her primary care provider. The patient does not have symptoms of cholangitis at this time and her liver enzymes are normal. The patient's dilated common bile duct needs to be investigated the patient will be set up for an MRCP. The patient has also been told to avoid the Carafate and Zantac and she will be started on Dexilant 40 mg a day. The patient has also been told to have a bland diet. The patient was told that if her symptoms get worse or she feels fevers or chills she should go to the ER or contact my office. The patient has been explained the plan and agrees with it.    Lucilla Lame, MD. Marval Regal   Note: This dictation was prepared with Dragon dictation along with smaller phrase technology. Any transcriptional errors that result from this process are unintentional.

## 2016-05-27 NOTE — Patient Instructions (Signed)
You are scheduled for an MRCP at [facility] on Friday, Dec 15th @ 8:00am. Please arrive at 7:30am and check in at the medical mall. You cannot have anything to eat or drink after midnight Thursday night.   If you need to cancel or reschedule for any reason, please contact central scheduling at [phone].

## 2016-06-03 ENCOUNTER — Telehealth: Payer: Self-pay | Admitting: Gastroenterology

## 2016-06-03 NOTE — Telephone Encounter (Signed)
Patient called and stated she has had 4 days of diarrhea. She started Imodium yesterday. She also said that Dr. Doy Hutching office may be calling her in something. She wasn't sure. Please call

## 2016-06-04 NOTE — Telephone Encounter (Signed)
Pt stated Dr. Doy Hutching had called her in [place] to her pharmacy yesterday. She had only taken one pill so far and doing okay. Advised to contact me if diarrhea doesn't improve.

## 2016-06-06 ENCOUNTER — Other Ambulatory Visit: Payer: Self-pay | Admitting: Gastroenterology

## 2016-06-06 ENCOUNTER — Ambulatory Visit
Admission: RE | Admit: 2016-06-06 | Discharge: 2016-06-06 | Disposition: A | Discharge status: Home / Self Care | Payer: PPO | Source: Ambulatory Visit | Attending: Gastroenterology | Admitting: Gastroenterology

## 2016-06-06 DIAGNOSIS — K838 Other specified diseases of biliary tract: Secondary | ICD-10-CM

## 2016-06-06 DIAGNOSIS — R933 Abnormal findings on diagnostic imaging of other parts of digestive tract: Secondary | ICD-10-CM | POA: Insufficient documentation

## 2016-06-09 ENCOUNTER — Telehealth: Payer: Self-pay

## 2016-06-09 NOTE — Telephone Encounter (Signed)
-----   Message from Lucilla Lame, MD sent at 06/06/2016 11:57 AM EST ----- Let the patient know they had a stone in the bile duct and should come in to talk about an ercp.

## 2016-06-09 NOTE — Telephone Encounter (Signed)
Contacted pt and informed her of MRI results and to schedule a follow up to discuss with Dr. Allen Norris. Pt just got off of work and will contact me when she gets home to schedule.

## 2016-06-09 NOTE — Telephone Encounter (Signed)
Pt notified of MRI results and scheduled for office appt to discuss possible ERCP.

## 2016-06-30 ENCOUNTER — Ambulatory Visit (INDEPENDENT_AMBULATORY_CARE_PROVIDER_SITE_OTHER): Payer: 59 | Admitting: Gastroenterology

## 2016-06-30 ENCOUNTER — Other Ambulatory Visit: Payer: Self-pay

## 2016-06-30 ENCOUNTER — Encounter: Payer: Self-pay | Admitting: Gastroenterology

## 2016-06-30 VITALS — BP 138/67 | HR 66 | Temp 98.4°F | Ht 65.0 in | Wt 175.0 lb

## 2016-06-30 DIAGNOSIS — K8050 Calculus of bile duct without cholangitis or cholecystitis without obstruction: Secondary | ICD-10-CM

## 2016-06-30 NOTE — Progress Notes (Signed)
Primary Care Physician: Idelle Crouch, MD  Primary Gastroenterologist:  Dr. Lucilla Lame  No chief complaint on file.   HPI: Natalie Benson is a 66 y.o. female here This patient comes today with a MRCP showing a dilated common bile duct of 1.3 cm. This was increased from 1.2 cm back in July 2015. There was a single filling defects seen in the distal common bile duct suspicious for a small distal common bile duct stone. The patient was told to follow-up with me after the MRCP.   Current Outpatient Prescriptions  Medication Sig Dispense Refill  . aspirin 81 MG tablet Take 81 mg by mouth daily.    Marland Kitchen atenolol (TENORMIN) 50 MG tablet TAKE ONE TABLET BY MOUTH EVERY DAY. *PATIENT MUST MAKE AN APPOINTMENT FOR ANY FURTHER REFILLS [phone]* (Patient not taking: Reported on 05/27/2016) 90 tablet 1  . dicyclomine (BENTYL) 20 MG tablet Take 1 tablet (20 mg total) by mouth 3 (three) times daily as needed for spasms. (Patient not taking: Reported on 05/27/2016) 30 tablet 0  . meloxicam (MOBIC) 7.5 MG tablet Take 7.5 mg by mouth daily.    . metoprolol succinate (TOPROL-XL) 100 MG 24 hr tablet Take 100 mg by mouth daily.    Marland Kitchen omeprazole (PRILOSEC) 20 MG capsule Take 20 mg by mouth daily.    . ondansetron (ZOFRAN) 4 MG tablet Take 1 tablet (4 mg total) by mouth daily as needed. (Patient not taking: Reported on 05/27/2016) 10 tablet 0  . pantoprazole (PROTONIX) 40 MG tablet Take 1 tablet (40 mg total) by mouth daily. (Patient not taking: Reported on 05/27/2016) 30 tablet 0  . simvastatin (ZOCOR) 20 MG tablet TAKE ONE TABLET BY MOUTH ONCE DAILY 90 tablet 0  . tiZANidine (ZANAFLEX) 4 MG tablet Take 4 mg by mouth at bedtime.     No current facility-administered medications for this visit.     Allergies as of 06/30/2016  . (No Known Allergies)    ROS:  General: Negative for anorexia, weight loss, fever, chills, fatigue, weakness. ENT: Negative for hoarseness, difficulty swallowing , nasal  congestion. CV: Negative for chest pain, angina, palpitations, dyspnea on exertion, peripheral edema.  Respiratory: Negative for dyspnea at rest, dyspnea on exertion, cough, sputum, wheezing.  GI: See history of present illness. GU:  Negative for dysuria, hematuria, urinary incontinence, urinary frequency, nocturnal urination.  Endo: Negative for unusual weight change.    Physical Examination:   There were no vitals taken for this visit.  General: Well-nourished, well-developed in no acute distress.  Eyes: No icterus. Conjunctivae pink. Extremities: No lower extremity edema. No clubbing or deformities. Neuro: Alert and oriented x 3.  Grossly intact. Skin: Warm and dry, no jaundice.   Psych: Alert and cooperative, normal mood and affect.  Labs:    Imaging Studies: Mr Abdomen Mrcp Wo Contrast  Result Date: 06/06/2016 CLINICAL DATA:  Common bile duct dilatation EXAM: MRI ABDOMEN WITHOUT CONTRAST  (INCLUDING MRCP) TECHNIQUE: Multiplanar multisequence MR imaging of the abdomen was performed. Heavily T2-weighted images of the biliary and pancreatic ducts were obtained, and three-dimensional MRCP images were rendered by post processing. COMPARISON:  CT 12/22/2013. FINDINGS: Lower chest: No pleural fluid. Hepatobiliary: No mass or other parenchymal abnormality identified. Previous cholecystectomy. Mild to moderate intrahepatic bile duct dilatation. The common bile duct is increased in caliber measuring 1.3 cm. There is a single low T2 signal intensity structure measuring 3 mm at the distal common bile duct, image number 16 of series 4. This is identified  on the T2 axial haste sequences only. Previously the common bile duct measured 1.2 cm. Pancreas: No mass, inflammatory changes, or other parenchymal abnormality identified. Spleen:  Within normal limits in size and appearance. Adrenals/Urinary Tract: No masses identified. No evidence of hydronephrosis. Normal appearance of the adrenal glands.  Stomach/Bowel: Visualized portions within the abdomen are unremarkable. Vascular/Lymphatic: No pathologically enlarged lymph nodes identified. No abdominal aortic aneurysm demonstrated. Other:  None. Musculoskeletal: No suspicious bone lesions identified. IMPRESSION: 1. Chronic increase caliber of the common bile duct which measures 1.3 cm. This is increased from 1.2 cm 12/22/2013 2. Single filling defect identified within the distal common bowel duct is visualized on only 1 sequence. Suspicious for small distal common bile duct stone. Electronically Signed   By: Kerby Moors M.D.   On: 06/06/2016 09:06   Mr 3d Recon At Scanner  Result Date: 06/06/2016 CLINICAL DATA:  Common bile duct dilatation EXAM: MRI ABDOMEN WITHOUT CONTRAST  (INCLUDING MRCP) TECHNIQUE: Multiplanar multisequence MR imaging of the abdomen was performed. Heavily T2-weighted images of the biliary and pancreatic ducts were obtained, and three-dimensional MRCP images were rendered by post processing. COMPARISON:  CT 12/22/2013. FINDINGS: Lower chest: No pleural fluid. Hepatobiliary: No mass or other parenchymal abnormality identified. Previous cholecystectomy. Mild to moderate intrahepatic bile duct dilatation. The common bile duct is increased in caliber measuring 1.3 cm. There is a single low T2 signal intensity structure measuring 3 mm at the distal common bile duct, image number 16 of series 4. This is identified on the T2 axial haste sequences only. Previously the common bile duct measured 1.2 cm. Pancreas: No mass, inflammatory changes, or other parenchymal abnormality identified. Spleen:  Within normal limits in size and appearance. Adrenals/Urinary Tract: No masses identified. No evidence of hydronephrosis. Normal appearance of the adrenal glands. Stomach/Bowel: Visualized portions within the abdomen are unremarkable. Vascular/Lymphatic: No pathologically enlarged lymph nodes identified. No abdominal aortic aneurysm demonstrated.  Other:  None. Musculoskeletal: No suspicious bone lesions identified. IMPRESSION: 1. Chronic increase caliber of the common bile duct which measures 1.3 cm. This is increased from 1.2 cm 12/22/2013 2. Single filling defect identified within the distal common bowel duct is visualized on only 1 sequence. Suspicious for small distal common bile duct stone. Electronically Signed   By: Kerby Moors M.D.   On: 06/06/2016 09:06    Assessment and Plan:   Natalie Benson is a 66 y.o. y/o female who had an MRCP which was suggestive of a common bile duct stone. The duct is also dilated. The patient has been told the results of the MRCP and the risks including pancreatitis perforation and death. She was also told of the risk of pancreatitis if there is a stone in the common bile duct and causes obstruction. The patient will be set up for an ERCP. The patient has been explained the plan and agrees with it.

## 2016-07-01 ENCOUNTER — Other Ambulatory Visit: Payer: Self-pay | Admitting: *Deleted

## 2016-07-08 ENCOUNTER — Encounter
Admission: RE | Disposition: A | Discharge status: Home / Self Care | Payer: Self-pay | Source: Ambulatory Visit | Attending: Gastroenterology

## 2016-07-08 ENCOUNTER — Ambulatory Visit: Payer: Medicare HMO | Admitting: Anesthesiology

## 2016-07-08 ENCOUNTER — Ambulatory Visit
Admission: RE | Admit: 2016-07-08 | Discharge: 2016-07-08 | Disposition: A | Discharge status: Home / Self Care | Payer: Medicare HMO | Source: Ambulatory Visit | Attending: Gastroenterology | Admitting: Gastroenterology

## 2016-07-08 ENCOUNTER — Encounter: Payer: Self-pay | Admitting: Anesthesiology

## 2016-07-08 DIAGNOSIS — I10 Essential (primary) hypertension: Secondary | ICD-10-CM | POA: Diagnosis not present

## 2016-07-08 DIAGNOSIS — F17210 Nicotine dependence, cigarettes, uncomplicated: Secondary | ICD-10-CM | POA: Insufficient documentation

## 2016-07-08 DIAGNOSIS — Z90710 Acquired absence of both cervix and uterus: Secondary | ICD-10-CM | POA: Diagnosis not present

## 2016-07-08 DIAGNOSIS — Z7982 Long term (current) use of aspirin: Secondary | ICD-10-CM | POA: Diagnosis not present

## 2016-07-08 DIAGNOSIS — J449 Chronic obstructive pulmonary disease, unspecified: Secondary | ICD-10-CM | POA: Diagnosis not present

## 2016-07-08 DIAGNOSIS — K219 Gastro-esophageal reflux disease without esophagitis: Secondary | ICD-10-CM | POA: Insufficient documentation

## 2016-07-08 DIAGNOSIS — Z8719 Personal history of other diseases of the digestive system: Secondary | ICD-10-CM | POA: Insufficient documentation

## 2016-07-08 DIAGNOSIS — Z800 Family history of malignant neoplasm of digestive organs: Secondary | ICD-10-CM | POA: Diagnosis not present

## 2016-07-08 DIAGNOSIS — K831 Obstruction of bile duct: Secondary | ICD-10-CM | POA: Insufficient documentation

## 2016-07-08 DIAGNOSIS — I739 Peripheral vascular disease, unspecified: Secondary | ICD-10-CM | POA: Insufficient documentation

## 2016-07-08 DIAGNOSIS — K838 Other specified diseases of biliary tract: Secondary | ICD-10-CM

## 2016-07-08 DIAGNOSIS — E785 Hyperlipidemia, unspecified: Secondary | ICD-10-CM | POA: Insufficient documentation

## 2016-07-08 DIAGNOSIS — Z853 Personal history of malignant neoplasm of breast: Secondary | ICD-10-CM | POA: Diagnosis not present

## 2016-07-08 HISTORY — PX: ERCP: SHX5425

## 2016-07-08 SURGERY — ERCP, WITH INTERVENTION IF INDICATED
Anesthesia: General

## 2016-07-08 MED ORDER — IPRATROPIUM-ALBUTEROL 0.5-2.5 (3) MG/3ML IN SOLN
3.0000 mL | Freq: Once | RESPIRATORY_TRACT | Status: AC
  Administered 2016-07-08: 3 mL via RESPIRATORY_TRACT
  Filled 2016-07-08: qty 3

## 2016-07-08 MED ORDER — LIDOCAINE HCL (PF) 1 % IJ SOLN
INTRAMUSCULAR | Status: AC
  Filled 2016-07-08: qty 2

## 2016-07-08 MED ORDER — ONDANSETRON 4 MG PO TBDP
4.0000 mg | ORAL_TABLET | Freq: Once | ORAL | Status: AC
  Administered 2016-07-08: 4 mg via ORAL

## 2016-07-08 MED ORDER — LIDOCAINE HCL (PF) 2 % IJ SOLN
INTRAMUSCULAR | Status: AC
  Filled 2016-07-08: qty 2

## 2016-07-08 MED ORDER — PROPOFOL 500 MG/50ML IV EMUL
INTRAVENOUS | Status: DC | PRN
  Administered 2016-07-08: 150 ug/kg/min via INTRAVENOUS

## 2016-07-08 MED ORDER — SODIUM CHLORIDE 0.9 % IV SOLN
INTRAVENOUS | Status: DC
  Administered 2016-07-08: 1000 mL via INTRAVENOUS

## 2016-07-08 MED ORDER — GLUCAGON HCL (RDNA) 1 MG IJ SOLR
INTRAMUSCULAR | Status: DC | PRN
  Administered 2016-07-08: .5 mL via INTRAVENOUS

## 2016-07-08 MED ORDER — PROPOFOL 500 MG/50ML IV EMUL
INTRAVENOUS | Status: AC
  Filled 2016-07-08: qty 50

## 2016-07-08 MED ORDER — INDOMETHACIN 50 MG RE SUPP
100.0000 mg | Freq: Once | RECTAL | Status: AC
  Administered 2016-07-08: 100 mg via RECTAL

## 2016-07-08 MED ORDER — PROPOFOL 10 MG/ML IV BOLUS
INTRAVENOUS | Status: DC | PRN
  Administered 2016-07-08: 80 mg via INTRAVENOUS

## 2016-07-08 MED ORDER — SODIUM CHLORIDE 0.9 % IV SOLN: INTRAVENOUS | Status: DC

## 2016-07-08 MED ORDER — GLYCOPYRROLATE 0.2 MG/ML IJ SOLN
INTRAMUSCULAR | Status: AC
  Filled 2016-07-08: qty 1

## 2016-07-08 MED ORDER — GLYCOPYRROLATE 0.2 MG/ML IJ SOLN
INTRAMUSCULAR | Status: DC | PRN
  Administered 2016-07-08: 0.1 mg via INTRAVENOUS

## 2016-07-08 MED ORDER — LIDOCAINE HCL (PF) 2 % IJ SOLN
INTRAMUSCULAR | Status: DC | PRN
  Administered 2016-07-08: 80 mg via INTRADERMAL

## 2016-07-08 NOTE — Anesthesia Post-op Follow-up Note (Cosign Needed)
Anesthesia QCDR form completed.        

## 2016-07-08 NOTE — OR Nursing (Signed)
Pt bp's elevated, DR Pistacatelli checked BP'S and instructed pt to take bp pills as soon as she returned home.the patient dressed and then she felt nausueated. DR Pistacalli ordered po zofran and pt discharged home.

## 2016-07-08 NOTE — Anesthesia Procedure Notes (Signed)
Date/Time: 07/08/2016 9:45 AM Performed by: Hedda Slade Pre-anesthesia Checklist: Patient identified, Emergency Drugs available, Suction available and Patient being monitored Oxygen Delivery Method: Nasal cannula

## 2016-07-08 NOTE — Transfer of Care (Signed)
Immediate Anesthesia Transfer of Care Note  Patient: Natalie Benson  Procedure(s) Performed: Procedure(s): ENDOSCOPIC RETROGRADE CHOLANGIOPANCREATOGRAPHY (ERCP) (N/A)  Patient Location: PACU  Anesthesia Type:General  Level of Consciousness: sedated  Airway & Oxygen Therapy: Patient Spontanous Breathing and Patient connected to nasal cannula oxygen  Post-op Assessment: Report given to RN and Post -op Vital signs reviewed and stable  Post vital signs: Reviewed and stable  Last Vitals:  Vitals:   07/08/16 1030 07/08/16 1032  BP:  (!) 168/80  Pulse: 74 72  Resp: 20 20  Temp: 36.7 C 36.7 C    Last Pain:  Vitals:   07/08/16 1032  TempSrc: Tympanic         Complications: No apparent anesthesia complications

## 2016-07-08 NOTE — Anesthesia Preprocedure Evaluation (Addendum)
Anesthesia Evaluation  Patient identified by MRN, date of birth, ID band Patient awake    Reviewed: Allergy & Precautions, H&P , NPO status , Patient's Chart, lab work & pertinent test results  History of Anesthesia Complications Negative for: history of anesthetic complications  Airway Mallampati: III  TM Distance: <3 FB Neck ROM: limited    Dental  (+) Poor Dentition, Chipped, Implants   Pulmonary neg shortness of breath, COPD, Current Smoker,    Pulmonary exam normal  + decreased breath sounds  rales    Cardiovascular Exercise Tolerance: Good hypertension, (-) angina+ Peripheral Vascular Disease  (-) Past MI and (-) DOE Normal cardiovascular exam Rhythm:regular Rate:Normal     Neuro/Psych PSYCHIATRIC DISORDERS negative neurological ROS     GI/Hepatic Neg liver ROS, GERD  Medicated and Controlled,  Endo/Other  negative endocrine ROS  Renal/GU negative Renal ROS  negative genitourinary   Musculoskeletal   Abdominal   Peds  Hematology negative hematology ROS (+)   Anesthesia Other Findings Past Medical History: No date: Cancer ([place])     Comment: breast No date: Colitis     Comment: collagenous No date: GERD (gastroesophageal reflux disease) No date: Hyperlipidemia No date: Hypertension No date: PVD (peripheral vascular disease) (HCC)     Comment: right carotid bruit No date: Tobacco abuse  Past Surgical History: No date: ABDOMINAL HYSTERECTOMY     Comment: vaginal No date: APPENDECTOMY No date: CHOLECYSTECTOMY 01/12/2015: COLONOSCOPY WITH PROPOFOL N/A     Comment: Procedure: COLONOSCOPY WITH PROPOFOL;                Surgeon: Lollie Sails, MD;  Location: [facility]              ENDOSCOPY;  Service: Endoscopy;  Laterality:               N/A; No date: MASTECTOMY No date: NOSE SURGERY     Reproductive/Obstetrics negative OB ROS                             Anesthesia  Physical Anesthesia Plan  ASA: III  Anesthesia Plan: General   Post-op Pain Management:    Induction:   Airway Management Planned:   Additional Equipment:   Intra-op Plan:   Post-operative Plan:   Informed Consent: I have reviewed the patients History and Physical, chart, labs and discussed the procedure including the risks, benefits and alternatives for the proposed anesthesia with the patient or authorized representative who has indicated his/her understanding and acceptance.   Dental Advisory Given  Plan Discussed with: Anesthesiologist, CRNA and Surgeon  Anesthesia Plan Comments: (In the preop area a peripheral IV was placed in the patient's hand before she told our team that she has had bilateral mastectomies.  She reports no episodes of lymphedema on either arm.  Hospital policy is that our teams try to avoid peripheral IVs or BP cuffs in patient's upper extremities if the patient has had node dissection on that side. Discussion with patient about this with plan to place peripheral IV in patient's foot and then remove hand IV.  After 3 attempts we were unsuccessful at placing peripheral IV in patient's foot.   New discussion with the patient.  That with her consent we would and use the IV in her hand.  The patient has had no episodes of lymphedema in either arm, so it is my understanding that she is at very low risk for lymphedema from this  IV.   The option was given to the patient that we could continue to work on peripheral IV access in her feet and remove the IV in her hand.  She requested that we use the IV in her hand and not continue to try to place a foot IV.  She was consented that there is a low risk that she could developed lymphedema in her hand and arm from his hand IV.  She voiced understanding and wished [place] to proceed with this IV as opposed to remove the IV and place an IV in her foot. Plan for BP cuff on lower extremity.)      Anesthesia Quick Evaluation

## 2016-07-08 NOTE — Op Note (Signed)
[facility] Gastroenterology Patient Name: Natalie Benson Procedure Date: 07/08/2016 9:47 AM MRN: [id number] Account #: [id number] Date of Birth: 1950-09-13 Admit Type: Outpatient Age: 66 Room: [facility] ENDO ROOM 4 Gender: Female Note Status: Finalized Procedure:            ERCP Indications:          Biliary dilation on Ultrasound, Suspected bile duct                        stone(s) Providers:            Lucilla Lame MD, MD Referring MD:         Leonie Douglas. Doy Hutching, MD (Referring MD) Medicines:            Propofol per Anesthesia Complications:        No immediate complications. Procedure:            Pre-Anesthesia Assessment:                       - Prior to the procedure, a History and Physical was                        performed, and patient medications and allergies were                        reviewed. The patient's tolerance of previous                        anesthesia was also reviewed. The risks and benefits of                        the procedure and the sedation options and risks were                        discussed with the patient. All questions were                        answered, and informed consent was obtained. Prior                        Anticoagulants: The patient has taken no previous                        anticoagulant or antiplatelet agents. ASA Grade                        Assessment: II - A patient with mild systemic disease.                        After reviewing the risks and benefits, the patient was                        deemed in satisfactory condition to undergo the                        procedure.                       After obtaining informed consent, the scope was passed  under direct vision. Throughout the procedure, the                        patient's blood pressure, pulse, and oxygen saturations                        were monitored continuously. The Endoscope was                        introduced through the  mouth, and used to inject                        contrast into and used to inject contrast into the bile                        duct. The ERCP was accomplished without difficulty. The                        patient tolerated the procedure well. Findings:      The scout film was normal. The esophagus was successfully intubated       under direct vision. The scope was advanced to a normal major papilla in       the descending duodenum without detailed examination of the pharynx,       larynx and associated structures, and upper GI tract. The upper GI tract       was grossly normal. The bile duct was deeply cannulated with the       short-nosed traction sphincterotome. Contrast was injected. I personally       interpreted the bile duct images. There was brisk flow of contrast       through the ducts. Image quality was excellent. Contrast extended to the       entire biliary tree. The main bile duct was diffusely dilated. The lower       third of the main bile duct contained a single localized stenosis. A       wire was passed into the biliary tree. Biliary sphincterotomy was made       with a traction (standard) sphincterotome using ERBE electrocautery.       There was no post-sphincterotomy bleeding. The biliary tree was swept       with a 15 mm balloon starting at the bifurcation. Sludge was swept from       the duct. 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                       - The entire main bile duct was dilated.                       - A biliary sphincterotomy was performed.                       - The biliary tree was swept and sludge was found. Recommendation:       - Discharge patient to home.                       - Clear liquid diet today. Procedure Code(s):    --- Professional ---  [phone], Endoscopic retrograde cholangiopancreatography                        (ERCP); with removal of calculi/debris from                         biliary/pancreatic duct(s)                       43262, Endoscopic retrograde cholangiopancreatography                        (ERCP); with sphincterotomy/papillotomy                       [id number], Endoscopic catheterization of the biliary ductal                        system, radiological supervision and interpretation Diagnosis Code(s):    --- Professional ---                       K83.8, Other specified diseases of biliary tract                       K83.1, Obstruction of bile duct CPT copyright 2016 American Medical Association. All rights reserved. The codes documented in this report are preliminary and upon coder review may  be revised to meet current compliance requirements. Lucilla Lame MD, MD 07/08/2016 10:23:17 AM This report has been signed electronically. Number of Addenda: 0 Note Initiated On: 07/08/2016 9:47 AM      [facility]

## 2016-07-08 NOTE — Anesthesia Postprocedure Evaluation (Addendum)
Anesthesia Post Note  Patient: Natalie Benson  Procedure(s) Performed: Procedure(s) (LRB): ENDOSCOPIC RETROGRADE CHOLANGIOPANCREATOGRAPHY (ERCP) (N/A)  Patient location during evaluation: Endoscopy Anesthesia Type: General Level of consciousness: awake and alert Pain management: pain level controlled Vital Signs Assessment: post-procedure vital signs reviewed and stable Respiratory status: spontaneous breathing, nonlabored ventilation, respiratory function stable and patient connected to nasal cannula oxygen Cardiovascular status: blood pressure returned to baseline and stable Postop Assessment: no signs of nausea or vomiting Anesthetic complications: no Comments: In the preop area a peripheral IV was placed in the patient's hand before she told our team that she has had bilateral mastectomies.  She reports no episodes of lymphedema on either arm.  Hospital policy is that our teams try to avoid peripheral IVs or BP cuffs in patient's upper extremities if the patient has had node dissection on that side. Discussion with patient about this with plan to place peripheral IV in patient's foot and then remove hand IV.  After 3 attempts we were unsuccessful at placing peripheral IV in patient's foot.   New discussion with the patient.  That with her consent we would and use the IV in her hand.  The patient has had no episodes of lymphedema in either arm, so it is my understanding that she is at very low risk for lymphedema from this IV.   The option was given to the patient that we could continue to work on peripheral IV access in her feet and remove the IV in her hand.  She requested that we use the IV in her hand and not continue to try to place a foot IV.  She was consented that there is a low risk that she could developed lymphedema in her hand and arm from his hand IV.  She voiced understanding and wished [place] to proceed with this IV as opposed to remove the IV and place an IV in her foot.  At time  of discharge after IV was removed that arm and hand were at baseline with no edema.  Patient plans to take PO BP meds when she gets home as she did not take them this morning.     Last Vitals:  Vitals:   07/08/16 1040 07/08/16 1050  BP:  (!) 197/100  Pulse: 70 64  Resp: 20 20  Temp:      Last Pain:  Vitals:   07/08/16 1050  TempSrc: Tympanic                 Precious Haws Aeryn Medici

## 2016-07-08 NOTE — H&P (Signed)
Lucilla Lame, MD [address] [place], [place] [address] Phone: [phone] Fax : [phone]  Primary Care Physician:  Idelle Crouch, MD Primary Gastroenterologist:  Dr. Allen Norris  Pre-Procedure History & Physical: HPI:  KYRSTLE KAUFHOLD is a 66 y.o. female is here for an ERCP.   Past Medical History:  Diagnosis Date  . Cancer (HCC)    breast  . Colitis    collagenous  . GERD (gastroesophageal reflux disease)   . Hyperlipidemia   . Hypertension   . PVD (peripheral vascular disease) ([place])    right carotid bruit  . Tobacco abuse     Past Surgical History:  Procedure Laterality Date  . ABDOMINAL HYSTERECTOMY     vaginal  . APPENDECTOMY    . CHOLECYSTECTOMY    . COLONOSCOPY WITH PROPOFOL N/A 01/12/2015   Procedure: COLONOSCOPY WITH PROPOFOL;  Surgeon: Lollie Sails, MD;  Location: [facility] ENDOSCOPY;  Service: Endoscopy;  Laterality: N/A;  . MASTECTOMY    . NOSE SURGERY      Prior to Admission medications   Medication Sig Start Date End Date Taking? Authorizing Provider  aspirin 81 MG tablet Take 81 mg by mouth daily.   Yes Historical Provider, MD  atenolol (TENORMIN) 50 MG tablet TAKE ONE TABLET BY MOUTH EVERY DAY. *PATIENT MUST MAKE AN APPOINTMENT FOR ANY FURTHER REFILLS [phone]* 07/10/14  Yes Jearld Fenton, NP  dicyclomine (BENTYL) 20 MG tablet Take 1 tablet (20 mg total) by mouth 3 (three) times daily as needed for spasms. 05/23/16 05/23/17 Yes Orbie Pyo, MD  meloxicam (MOBIC) 7.5 MG tablet Take 7.5 mg by mouth daily.   Yes Historical Provider, MD  metoprolol succinate (TOPROL-XL) 100 MG 24 hr tablet Take 100 mg by mouth daily.   Yes Historical Provider, MD  ondansetron (ZOFRAN) 4 MG tablet Take 1 tablet (4 mg total) by mouth daily as needed. 05/23/16  Yes Orbie Pyo, MD  pantoprazole (PROTONIX) 40 MG tablet Take 1 tablet (40 mg total) by mouth daily. 03/31/14  Yes Jearld Fenton, NP  simvastatin (ZOCOR) 20 MG tablet TAKE ONE  TABLET BY MOUTH ONCE DAILY 05/22/14  Yes Jearld Fenton, NP  tiZANidine (ZANAFLEX) 4 MG tablet Take 4 mg by mouth at bedtime.   Yes Historical Provider, MD  omeprazole (PRILOSEC) 20 MG capsule Take 20 mg by mouth daily.    Historical Provider, MD    Allergies as of 07/01/2016  . (No Known Allergies)    Family History  Problem Relation Age of Onset  . Congestive Heart Failure Father   . Cancer Mother     Gi Cancer  . Cancer Sister     breast  . COPD Brother   . Hypertension Brother     Social History   Social History  . Marital status: Divorced    Spouse name: N/A  . Number of children: N/A  . Years of education: N/A   Occupational History  . Not on file.   Social History Main Topics  . Smoking status: Current Every Day Smoker    Packs/day: 1.50    Types: Cigarettes  . Smokeless tobacco: Never Used  . Alcohol use Yes     Comment: occasional--wine  . Drug use: No  . Sexual activity: Not on file   Other Topics Concern  . Not on file   Social History Narrative  . No narrative on file    Review of Systems: See HPI, otherwise negative ROS  Physical Exam:  There were no vitals taken for this visit. General:   Alert,  pleasant and cooperative in NAD Head:  Normocephalic and atraumatic. Neck:  Supple; no masses or thyromegaly. Lungs:  Clear throughout to auscultation.    Heart:  Regular rate and rhythm. Abdomen:  Soft, nontender and nondistended. Normal bowel sounds, without guarding, and without rebound.   Neurologic:  Alert and  oriented x4;  grossly normal neurologically.  Impression/Plan: RANJIT ROZYCKI is here for an ERCP to be performed for CBD stone  Risks, benefits, limitations, and alternatives regarding  ERCP have been reviewed with the patient.  Questions have been answered.  All parties agreeable.   Lucilla Lame, MD  07/08/2016, 9:29 AM

## 2016-07-11 ENCOUNTER — Encounter: Payer: Self-pay | Admitting: Gastroenterology

## 2017-10-15 ENCOUNTER — Telehealth: Payer: Self-pay | Admitting: *Deleted

## 2017-10-15 DIAGNOSIS — Z87891 Personal history of nicotine dependence: Secondary | ICD-10-CM

## 2017-10-15 DIAGNOSIS — Z122 Encounter for screening for malignant neoplasm of respiratory organs: Secondary | ICD-10-CM

## 2017-10-15 NOTE — Telephone Encounter (Signed)
Received referral for initial lung cancer screening scan. Contacted patient and obtained smoking history,(current, 50 pack year) as well as answering questions related to screening process. Patient denies signs of lung cancer such as weight loss or hemoptysis. Patient denies comorbidity that would prevent curative treatment if lung cancer were found. Patient is scheduled for shared decision making visit and CT scan on 10/30/17.

## 2017-10-30 ENCOUNTER — Telehealth: Payer: Self-pay | Admitting: *Deleted

## 2017-10-30 ENCOUNTER — Inpatient Hospital Stay: Payer: No Typology Code available for payment source | Admitting: Nurse Practitioner

## 2017-10-30 ENCOUNTER — Ambulatory Visit: Payer: Medicare HMO

## 2017-10-30 ENCOUNTER — Inpatient Hospital Stay: Admission: RE | Admit: 2017-10-30 | Payer: PPO | Source: Ambulatory Visit

## 2017-10-30 NOTE — Telephone Encounter (Signed)
Patient cancelled lung screening scan. Does not want to reschedule.

## 2019-06-01 ENCOUNTER — Other Ambulatory Visit: Payer: Self-pay | Admitting: Pulmonary Disease

## 2019-06-01 DIAGNOSIS — J449 Chronic obstructive pulmonary disease, unspecified: Secondary | ICD-10-CM

## 2019-06-01 DIAGNOSIS — R0600 Dyspnea, unspecified: Secondary | ICD-10-CM

## 2019-06-02 ENCOUNTER — Other Ambulatory Visit: Payer: Self-pay | Admitting: Pulmonary Disease

## 2019-06-02 DIAGNOSIS — J449 Chronic obstructive pulmonary disease, unspecified: Secondary | ICD-10-CM

## 2019-06-13 ENCOUNTER — Ambulatory Visit
Admission: RE | Admit: 2019-06-13 | Discharge: 2019-06-13 | Disposition: A | Discharge status: Home / Self Care | Payer: Medicare HMO | Source: Ambulatory Visit | Attending: Pulmonary Disease | Admitting: Pulmonary Disease

## 2019-06-13 ENCOUNTER — Other Ambulatory Visit: Payer: Self-pay

## 2019-06-13 ENCOUNTER — Ambulatory Visit: Payer: Self-pay

## 2019-06-13 DIAGNOSIS — R0600 Dyspnea, unspecified: Secondary | ICD-10-CM | POA: Diagnosis present

## 2019-06-13 DIAGNOSIS — J449 Chronic obstructive pulmonary disease, unspecified: Secondary | ICD-10-CM | POA: Insufficient documentation

## 2020-05-23 ENCOUNTER — Other Ambulatory Visit: Payer: Self-pay | Admitting: Internal Medicine

## 2020-05-23 ENCOUNTER — Other Ambulatory Visit (HOSPITAL_COMMUNITY): Payer: Self-pay | Admitting: Internal Medicine

## 2020-05-23 DIAGNOSIS — R911 Solitary pulmonary nodule: Secondary | ICD-10-CM

## 2020-06-06 ENCOUNTER — Other Ambulatory Visit: Payer: Self-pay

## 2020-06-06 ENCOUNTER — Ambulatory Visit
Admission: RE | Admit: 2020-06-06 | Discharge: 2020-06-06 | Disposition: A | Discharge status: Home / Self Care | Payer: Medicare HMO | Source: Ambulatory Visit | Attending: Internal Medicine | Admitting: Internal Medicine

## 2020-06-06 DIAGNOSIS — R911 Solitary pulmonary nodule: Secondary | ICD-10-CM

## 2021-04-10 IMAGING — CT CT CHEST W/O CM
2 of 4 series · 15 of 36 positions shown, 18 images · non-contrast
Comparison: 06/13/2019

CLINICAL DATA: Cough for 2-3 years. History of emphysema and
history of COVID pneumonia. Remote history of left breast cancer and
right breast cancer.

EXAM:
CT CHEST WITHOUT CONTRAST
TECHNIQUE: Multidetector CT imaging of the chest was performed following the
standard protocol without IV contrast.

[Series 2: chest 2.00 · axial · 0.67mm/px · z∈[-1181,-897]mm · 12 of 168 slices shown, 15 images]
[im 13/168  mediastinal]
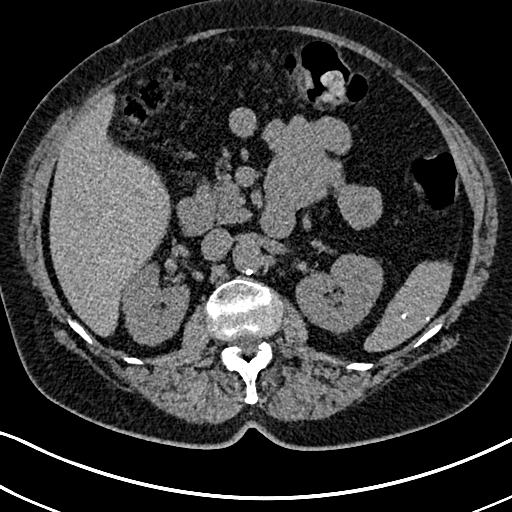
[im 13/168  lung]
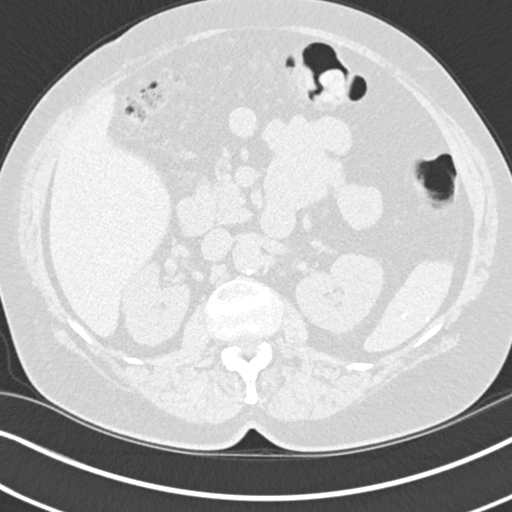
[im 26/168  lung]
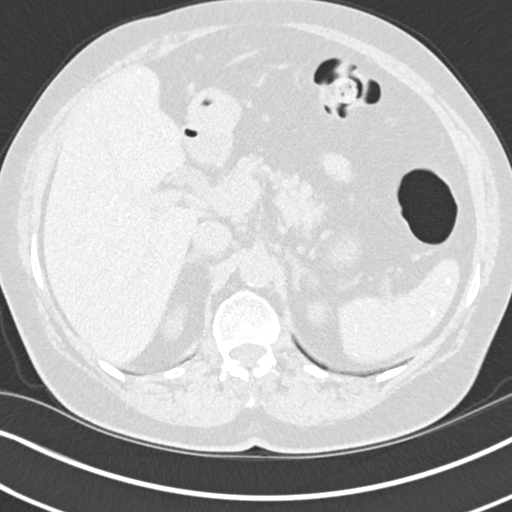
[im 39/168  lung]
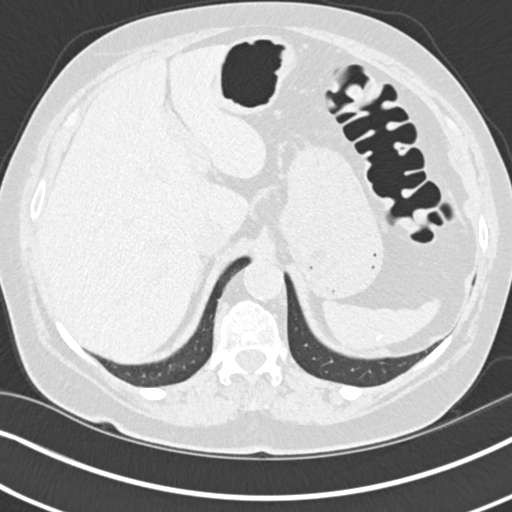
[im 52/168  lung]
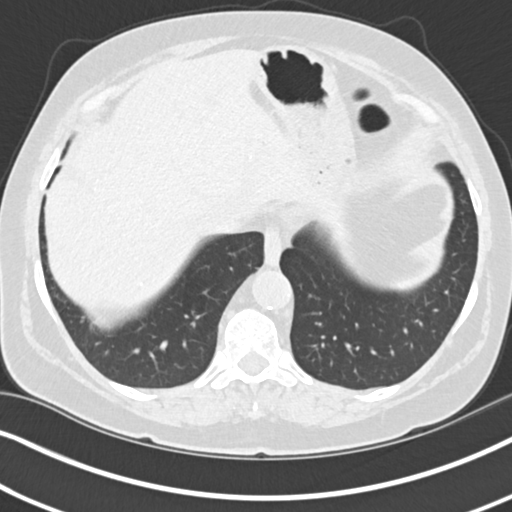
[im 65/168  mediastinal]
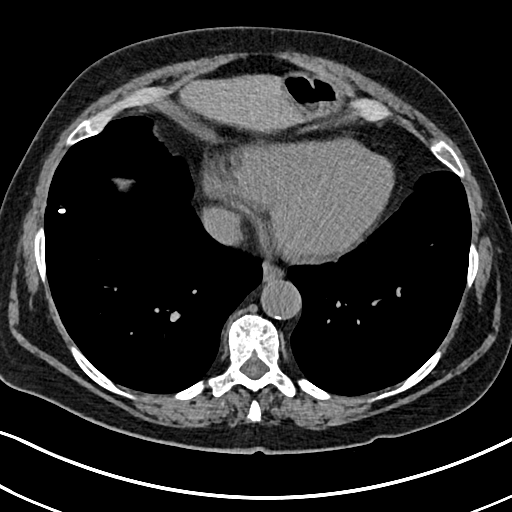
[im 65/168  lung]
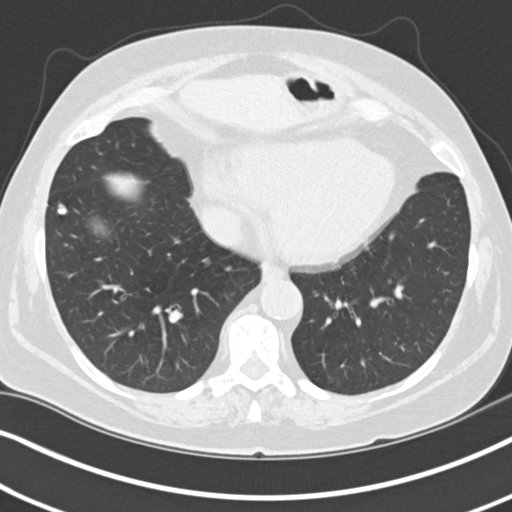
[im 78/168  lung]
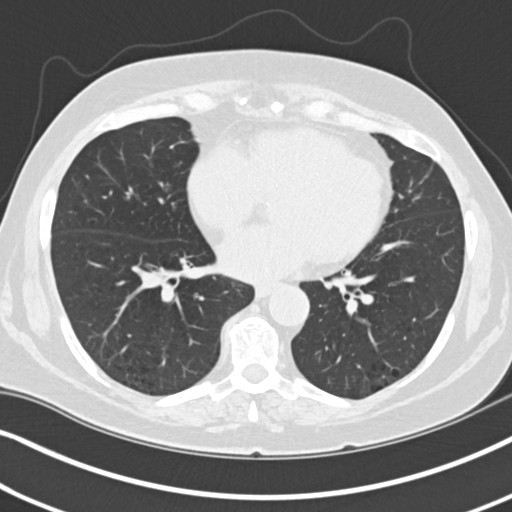
[im 90/168  lung]
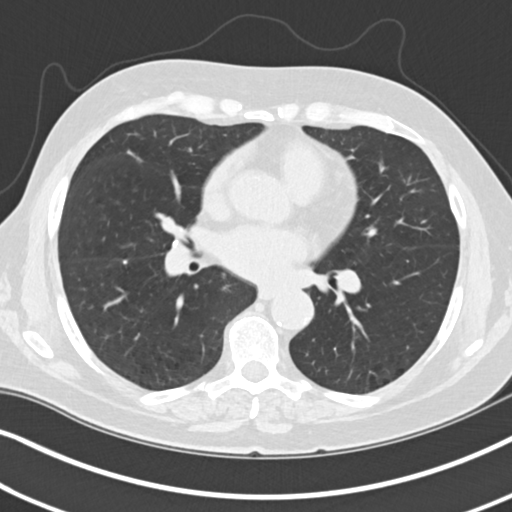
[im 103/168  lung]
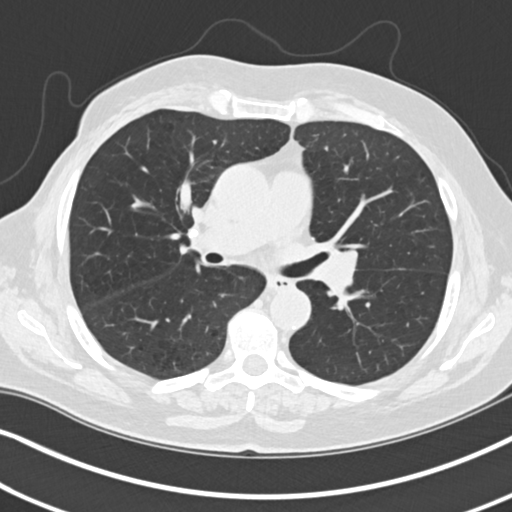
[im 116/168  mediastinal]
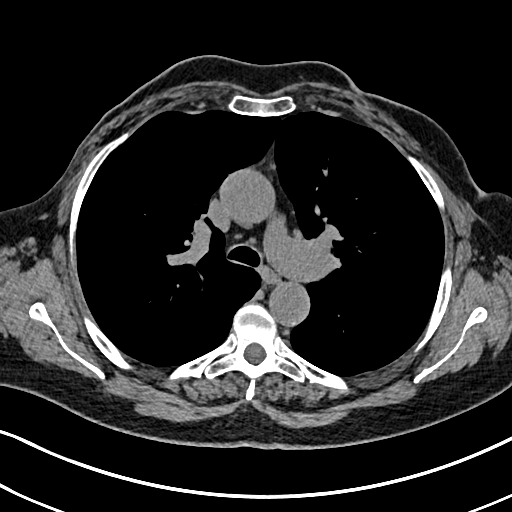
[im 116/168  lung]
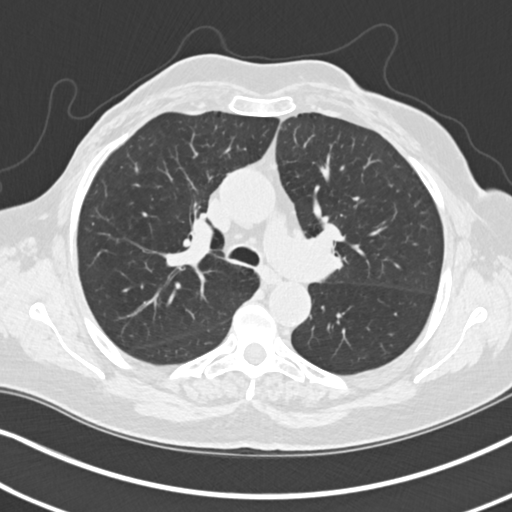
[im 129/168  lung]
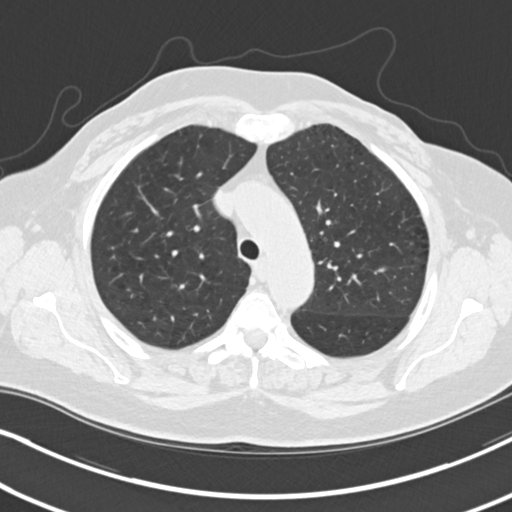
[im 142/168  lung]
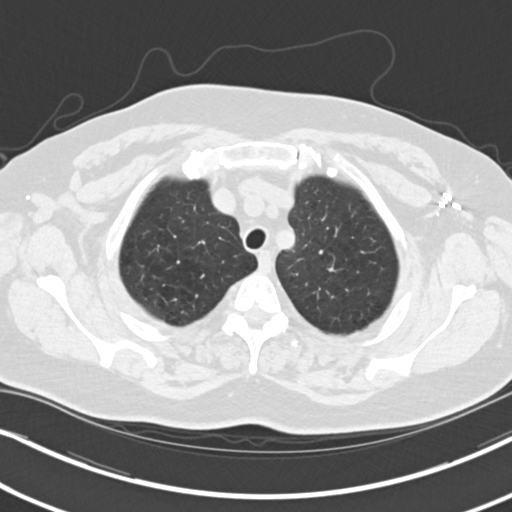
[im 155/168  lung]
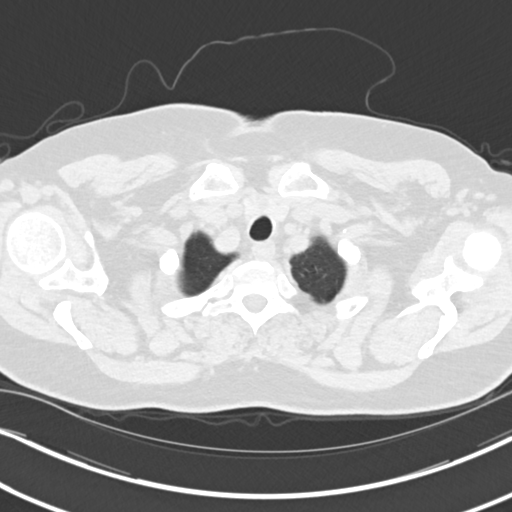

[Series 5: coronals chest 2.00 cor · coronal · 0.66mm/px · 3 of 152 slices shown]
[im 31/152  lung]
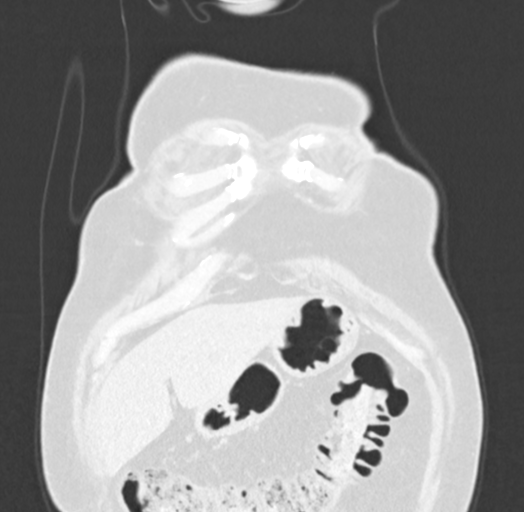
[im 61/152  lung]
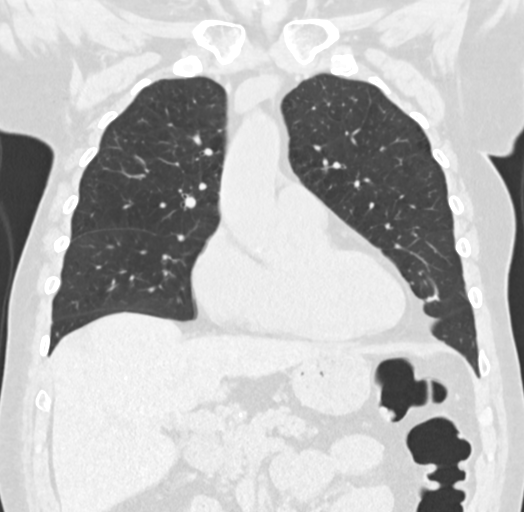
[im 91/152  lung]
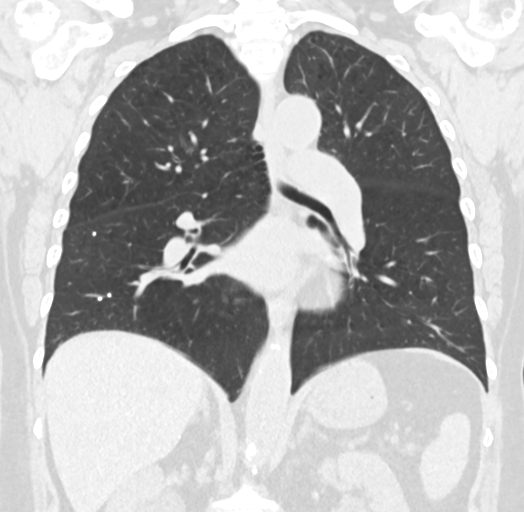

[15 of 36 positions shown; findings below may reference images not displayed]

FINDINGS: Cardiovascular: The heart is normal in size. No pericardial
effusion. The aorta is normal in caliber. Stable atherosclerotic
calcifications. Stable 2 vessel coronary artery calcifications.

Mediastinum/Nodes: Small scattered mediastinal and hilar lymph nodes
are stable. Stable calcified right hilar lymph nodes consistent with
remote granulomatous disease. No mass or overt adenopathy. The
esophagus is grossly normal.

Lungs/Pleura: Stable emphysematous changes. Areas of pulmonary
scarring are again demonstrated. Small oblong nodule in the right
upper lobe anteriorly on image number 41/3 is stable measuring
approximately 3 mm.

Stable 5 mm nodule in the right lower lobe on image number 78/3.
Small adjacent calcified granulomas are noted.

Stable calcified granulomas noted in the right lower lobe anteriorly
on image 106/3.

No new pulmonary lesions are identified. No pulmonary infiltrates or
pleural effusions.

No findings for interstitial lung disease or bronchiectasis.

Upper Abdomen: Small calcified granulomas are noted in the liver and
spleen. No worrisome hepatic or adrenal lesions. Stable aortic
atherosclerotic calcifications.

Musculoskeletal: Status post bilateral mastectomies. No chest wall
mass or axillary adenopathy. The thyroid gland is unremarkable.
Stable 9 mm nodule in the isthmus region. Not clinically
significant; no follow-up imaging recommended (ref: [HOSPITAL]. [DATE]): 143-50).

The bony thorax is intact. No acute bony findings or worrisome bone
lesions.
IMPRESSION: 1. Stable emphysematous changes and areas of pulmonary scarring.
2. Stable small right lung nodules. No new pulmonary lesions. Given
the emphysematous changes and history of smoking I would recommend a
follow-up noncontrast CT scan in 1 year to document 2 years of
stability of these nodules.
3. Stable calcified right hilar lymph nodes and calcified granulomas
in the right lung and liver and spleen.
4. No mediastinal or hilar mass or adenopathy.
5. Stable 2 vessel coronary artery calcifications.
6. Emphysema and aortic atherosclerosis.

Aortic Atherosclerosis (Q5A6G-9ER.R) and Emphysema (Q5A6G-8ZA.0).

## 2022-07-07 ENCOUNTER — Other Ambulatory Visit: Payer: Self-pay | Admitting: Internal Medicine

## 2022-07-07 DIAGNOSIS — I10 Essential (primary) hypertension: Secondary | ICD-10-CM

## 2022-07-07 DIAGNOSIS — R270 Ataxia, unspecified: Secondary | ICD-10-CM

## 2022-07-10 ENCOUNTER — Ambulatory Visit
Admission: RE | Admit: 2022-07-10 | Discharge: 2022-07-10 | Disposition: A | Discharge status: Home / Self Care | Payer: Medicare HMO | Source: Ambulatory Visit | Attending: Internal Medicine | Admitting: Internal Medicine

## 2022-07-10 DIAGNOSIS — R270 Ataxia, unspecified: Secondary | ICD-10-CM

## 2022-07-10 DIAGNOSIS — I10 Essential (primary) hypertension: Secondary | ICD-10-CM

## 2023-02-24 ENCOUNTER — Other Ambulatory Visit: Payer: Self-pay | Admitting: Physician Assistant

## 2023-02-24 ENCOUNTER — Ambulatory Visit
Admission: RE | Admit: 2023-02-24 | Discharge: 2023-02-24 | Disposition: A | Discharge status: Home / Self Care | Payer: Medicare HMO | Source: Ambulatory Visit | Attending: Physician Assistant | Admitting: Physician Assistant

## 2023-02-24 DIAGNOSIS — R413 Other amnesia: Secondary | ICD-10-CM | POA: Diagnosis present

## 2023-02-24 DIAGNOSIS — R531 Weakness: Secondary | ICD-10-CM | POA: Insufficient documentation

## 2023-12-08 ENCOUNTER — Other Ambulatory Visit: Payer: Self-pay | Admitting: Pulmonary Disease

## 2023-12-08 DIAGNOSIS — J449 Chronic obstructive pulmonary disease, unspecified: Secondary | ICD-10-CM

## 2023-12-08 DIAGNOSIS — F17290 Nicotine dependence, other tobacco product, uncomplicated: Secondary | ICD-10-CM

## 2023-12-08 DIAGNOSIS — Z87891 Personal history of nicotine dependence: Secondary | ICD-10-CM

## 2023-12-22 ENCOUNTER — Ambulatory Visit

## 2023-12-30 ENCOUNTER — Ambulatory Visit
Admission: RE | Admit: 2023-12-30 | Discharge: 2023-12-30 | Disposition: A | Discharge status: Home / Self Care | Source: Ambulatory Visit | Attending: Pulmonary Disease | Admitting: Pulmonary Disease

## 2023-12-30 DIAGNOSIS — J449 Chronic obstructive pulmonary disease, unspecified: Secondary | ICD-10-CM | POA: Insufficient documentation

## 2023-12-30 DIAGNOSIS — Z87891 Personal history of nicotine dependence: Secondary | ICD-10-CM | POA: Insufficient documentation

## 2023-12-30 DIAGNOSIS — F17290 Nicotine dependence, other tobacco product, uncomplicated: Secondary | ICD-10-CM | POA: Insufficient documentation
